# Patient Record
Sex: Male | Born: 1945
Health system: Southern US, Community
[De-identification: ages and names within clinical notes are randomized; demographics above are authoritative.]

## PROBLEM LIST (undated history)

## (undated) DIAGNOSIS — K200 Eosinophilic esophagitis: Secondary | ICD-10-CM

## (undated) DIAGNOSIS — E7800 Pure hypercholesterolemia, unspecified: Secondary | ICD-10-CM

## (undated) DIAGNOSIS — K222 Esophageal obstruction: Secondary | ICD-10-CM

## (undated) DIAGNOSIS — N400 Enlarged prostate without lower urinary tract symptoms: Secondary | ICD-10-CM

## (undated) DIAGNOSIS — K219 Gastro-esophageal reflux disease without esophagitis: Secondary | ICD-10-CM

## (undated) DIAGNOSIS — E119 Type 2 diabetes mellitus without complications: Secondary | ICD-10-CM

## (undated) DIAGNOSIS — C73 Malignant neoplasm of thyroid gland: Secondary | ICD-10-CM

## (undated) HISTORY — PX: ESOPHAGOGASTRODUODENOSCOPY (EGD) WITH ESOPHAGEAL DILATION: SHX5812

## (undated) HISTORY — DX: Type 2 diabetes mellitus without complications: E11.9

## (undated) HISTORY — DX: Pure hypercholesterolemia, unspecified: E78.00

## (undated) HISTORY — PX: KNEE ARTHROSCOPY: SHX127

## (undated) HISTORY — DX: Gastro-esophageal reflux disease without esophagitis: K21.9

## (undated) HISTORY — DX: Malignant neoplasm of thyroid gland: C73

## (undated) HISTORY — PX: OTHER SURGICAL HISTORY: SHX169

## (undated) HISTORY — DX: Benign prostatic hyperplasia without lower urinary tract symptoms: N40.0

## (undated) HISTORY — DX: Esophageal obstruction: K22.2

## (undated) HISTORY — PX: THYROIDECTOMY, PARTIAL: SHX18

## (undated) HISTORY — PX: COLONOSCOPY: SHX174

## (undated) HISTORY — PX: SHOULDER SURGERY: SHX246

## (undated) HISTORY — DX: Eosinophilic esophagitis: K20.0

---

## 1999-08-12 ENCOUNTER — Encounter: Payer: Self-pay | Admitting: *Deleted

## 1999-08-12 ENCOUNTER — Ambulatory Visit (HOSPITAL_COMMUNITY): Admission: RE | Admit: 1999-08-12 | Discharge: 1999-08-12 | Payer: Self-pay | Admitting: *Deleted

## 1999-08-30 ENCOUNTER — Ambulatory Visit (HOSPITAL_COMMUNITY): Admission: RE | Admit: 1999-08-30 | Discharge: 1999-08-30 | Payer: Self-pay | Admitting: *Deleted

## 1999-08-30 ENCOUNTER — Encounter: Payer: Self-pay | Admitting: *Deleted

## 2000-03-19 ENCOUNTER — Ambulatory Visit (HOSPITAL_COMMUNITY): Admission: RE | Admit: 2000-03-19 | Discharge: 2000-03-19 | Payer: Self-pay | Admitting: Dentistry

## 2000-03-19 ENCOUNTER — Encounter: Payer: Self-pay | Admitting: Endocrinology

## 2000-03-25 ENCOUNTER — Ambulatory Visit (HOSPITAL_COMMUNITY): Admission: RE | Admit: 2000-03-25 | Discharge: 2000-03-25 | Payer: Self-pay | Admitting: Endocrinology

## 2000-03-25 ENCOUNTER — Encounter: Payer: Self-pay | Admitting: Endocrinology

## 2002-04-07 ENCOUNTER — Encounter: Admission: RE | Admit: 2002-04-07 | Discharge: 2002-07-06 | Payer: Self-pay | Admitting: Endocrinology

## 2006-02-15 ENCOUNTER — Encounter: Admission: RE | Admit: 2006-02-15 | Discharge: 2006-02-15 | Payer: Self-pay | Admitting: Endocrinology

## 2008-09-09 ENCOUNTER — Ambulatory Visit (HOSPITAL_COMMUNITY): Admission: RE | Admit: 2008-09-09 | Discharge: 2008-09-09 | Payer: Self-pay | Admitting: *Deleted

## 2010-10-02 ENCOUNTER — Encounter: Payer: Self-pay | Admitting: Endocrinology

## 2010-10-03 ENCOUNTER — Encounter: Payer: Self-pay | Admitting: Endocrinology

## 2011-01-18 ENCOUNTER — Other Ambulatory Visit: Payer: Self-pay | Admitting: Internal Medicine

## 2011-01-18 ENCOUNTER — Ambulatory Visit
Admission: RE | Admit: 2011-01-18 | Discharge: 2011-01-18 | Disposition: A | Discharge status: Home / Self Care | Payer: Medicare Other | Source: Ambulatory Visit | Attending: Internal Medicine | Admitting: Internal Medicine

## 2011-01-18 DIAGNOSIS — M25561 Pain in right knee: Secondary | ICD-10-CM

## 2011-01-24 NOTE — Op Note (Signed)
NAMETEVITA, GOMER NO.:  [id number]   MEDICAL RECORD NO.:  [id number]          PATIENT TYPE:  AMB   LOCATION:  ENDO                         FACILITY:  [facility]   PHYSICIAN:  Georgiana Spinner, M.D.    DATE OF BIRTH:  05-Apr-1946   DATE OF PROCEDURE:  DATE OF DISCHARGE:                               OPERATIVE REPORT   PROCEDURE:  Upper endoscopy with dilation.   INDICATIONS:  Dysphagia.   ANESTHESIA:  Fentanyl 75 mcg, Versed 6 mg.   PROCEDURE:  With the patient mildly sedated in the left lateral  decubitus position, the Pentax videoscopic endoscope was inserted in the  mouth and passed under direct vision through the esophagus, which  appeared normal until we reached the distal esophagus and there appeared  to be a distal esophageal stricture.  We photographed this and entered  into the stomach.  The fundus, body, antrum were seen, with a hiatal  hernia noted.  The duodenal bulb, second portion of the duodenum were  entered and both appeared normal.  From this point the endoscope was  slowly withdrawn, taking circumferential views of the duodenal mucosa  until the endoscope had been pulled back into the stomach, placed in  retroflexion to view the stomach from below.  The endoscope was then  straightened.  A guidewire was passed.  The endoscope was withdrawn.  Subsequently a 15 Savary dilator was passed over the guidewire.  With  removal, there was a trace amount of blood on the dilator, so I elected  to pull the guidewire and reinsert the endoscope.  There was some blood  seen at the site of the stricture and it appeared that we had  successfully dilated the stricture, so therefore I elected to then  withdraw the endoscope, taking circumferential views of the remaining  gastric and esophageal mucosa.  The patient's vital signs, pulse  oximeter remained stable.  The patient tolerated the procedure well  without apparent complications.   FINDINGS:  Stricture of the  distal esophagus, dilated to 15 mm.   PLAN:  Will have the patient on a liquid diet today.  Is to resume a  regular diet tomorrow.  Start the patient on Prilosec therapy daily and  have the patient follow up with me in about 4-6 weeks as an outpatient  to assess clinical response.           ______________________________  Georgiana Spinner, M.D.     GMO/MEDQ  D:  09/09/2008  T:  09/09/2008  Job:  [id number]

## 2011-10-26 DIAGNOSIS — M25519 Pain in unspecified shoulder: Secondary | ICD-10-CM | POA: Diagnosis not present

## 2011-10-26 DIAGNOSIS — M719 Bursopathy, unspecified: Secondary | ICD-10-CM | POA: Diagnosis not present

## 2011-12-07 DIAGNOSIS — M19019 Primary osteoarthritis, unspecified shoulder: Secondary | ICD-10-CM | POA: Diagnosis not present

## 2011-12-15 DIAGNOSIS — M25529 Pain in unspecified elbow: Secondary | ICD-10-CM | POA: Diagnosis not present

## 2011-12-18 DIAGNOSIS — Q740 Other congenital malformations of upper limb(s), including shoulder girdle: Secondary | ICD-10-CM | POA: Diagnosis not present

## 2011-12-18 DIAGNOSIS — S43439A Superior glenoid labrum lesion of unspecified shoulder, initial encounter: Secondary | ICD-10-CM | POA: Diagnosis not present

## 2011-12-21 DIAGNOSIS — M19019 Primary osteoarthritis, unspecified shoulder: Secondary | ICD-10-CM | POA: Diagnosis not present

## 2011-12-21 DIAGNOSIS — M6580 Other synovitis and tenosynovitis, unspecified site: Secondary | ICD-10-CM | POA: Diagnosis not present

## 2011-12-21 DIAGNOSIS — Q740 Other congenital malformations of upper limb(s), including shoulder girdle: Secondary | ICD-10-CM | POA: Diagnosis not present

## 2011-12-21 DIAGNOSIS — M75100 Unspecified rotator cuff tear or rupture of unspecified shoulder, not specified as traumatic: Secondary | ICD-10-CM | POA: Diagnosis not present

## 2011-12-21 DIAGNOSIS — M25519 Pain in unspecified shoulder: Secondary | ICD-10-CM | POA: Diagnosis not present

## 2011-12-21 DIAGNOSIS — M719 Bursopathy, unspecified: Secondary | ICD-10-CM | POA: Diagnosis not present

## 2011-12-21 DIAGNOSIS — S43439A Superior glenoid labrum lesion of unspecified shoulder, initial encounter: Secondary | ICD-10-CM | POA: Diagnosis not present

## 2011-12-21 DIAGNOSIS — M25529 Pain in unspecified elbow: Secondary | ICD-10-CM | POA: Diagnosis not present

## 2011-12-21 DIAGNOSIS — M67919 Unspecified disorder of synovium and tendon, unspecified shoulder: Secondary | ICD-10-CM | POA: Diagnosis not present

## 2012-01-02 DIAGNOSIS — M25519 Pain in unspecified shoulder: Secondary | ICD-10-CM | POA: Diagnosis not present

## 2012-01-04 DIAGNOSIS — M25519 Pain in unspecified shoulder: Secondary | ICD-10-CM | POA: Diagnosis not present

## 2012-01-08 DIAGNOSIS — M25519 Pain in unspecified shoulder: Secondary | ICD-10-CM | POA: Diagnosis not present

## 2012-01-15 DIAGNOSIS — M25519 Pain in unspecified shoulder: Secondary | ICD-10-CM | POA: Diagnosis not present

## 2012-01-18 DIAGNOSIS — M25519 Pain in unspecified shoulder: Secondary | ICD-10-CM | POA: Diagnosis not present

## 2012-01-22 DIAGNOSIS — M25519 Pain in unspecified shoulder: Secondary | ICD-10-CM | POA: Diagnosis not present

## 2012-01-31 DIAGNOSIS — M25519 Pain in unspecified shoulder: Secondary | ICD-10-CM | POA: Diagnosis not present

## 2012-02-07 DIAGNOSIS — M25519 Pain in unspecified shoulder: Secondary | ICD-10-CM | POA: Diagnosis not present

## 2012-02-13 DIAGNOSIS — M25519 Pain in unspecified shoulder: Secondary | ICD-10-CM | POA: Diagnosis not present

## 2012-02-15 DIAGNOSIS — M25519 Pain in unspecified shoulder: Secondary | ICD-10-CM | POA: Diagnosis not present

## 2012-02-20 DIAGNOSIS — M25519 Pain in unspecified shoulder: Secondary | ICD-10-CM | POA: Diagnosis not present

## 2012-02-22 DIAGNOSIS — M25519 Pain in unspecified shoulder: Secondary | ICD-10-CM | POA: Diagnosis not present

## 2012-02-27 DIAGNOSIS — M25519 Pain in unspecified shoulder: Secondary | ICD-10-CM | POA: Diagnosis not present

## 2012-02-29 DIAGNOSIS — M25519 Pain in unspecified shoulder: Secondary | ICD-10-CM | POA: Diagnosis not present

## 2012-06-26 DIAGNOSIS — M25519 Pain in unspecified shoulder: Secondary | ICD-10-CM | POA: Diagnosis not present

## 2012-09-27 DIAGNOSIS — E119 Type 2 diabetes mellitus without complications: Secondary | ICD-10-CM | POA: Diagnosis not present

## 2012-09-27 DIAGNOSIS — E789 Disorder of lipoprotein metabolism, unspecified: Secondary | ICD-10-CM | POA: Diagnosis not present

## 2012-10-04 DIAGNOSIS — E0789 Other specified disorders of thyroid: Secondary | ICD-10-CM | POA: Diagnosis not present

## 2012-10-04 DIAGNOSIS — E119 Type 2 diabetes mellitus without complications: Secondary | ICD-10-CM | POA: Diagnosis not present

## 2012-10-04 DIAGNOSIS — E789 Disorder of lipoprotein metabolism, unspecified: Secondary | ICD-10-CM | POA: Diagnosis not present

## 2012-10-07 DIAGNOSIS — E0789 Other specified disorders of thyroid: Secondary | ICD-10-CM | POA: Diagnosis not present

## 2012-10-07 DIAGNOSIS — Z125 Encounter for screening for malignant neoplasm of prostate: Secondary | ICD-10-CM | POA: Diagnosis not present

## 2013-03-27 DIAGNOSIS — E789 Disorder of lipoprotein metabolism, unspecified: Secondary | ICD-10-CM | POA: Diagnosis not present

## 2013-03-27 DIAGNOSIS — E119 Type 2 diabetes mellitus without complications: Secondary | ICD-10-CM | POA: Diagnosis not present

## 2013-11-28 DIAGNOSIS — E789 Disorder of lipoprotein metabolism, unspecified: Secondary | ICD-10-CM | POA: Diagnosis not present

## 2013-11-28 DIAGNOSIS — E119 Type 2 diabetes mellitus without complications: Secondary | ICD-10-CM | POA: Diagnosis not present

## 2013-11-28 DIAGNOSIS — E0789 Other specified disorders of thyroid: Secondary | ICD-10-CM | POA: Diagnosis not present

## 2014-03-20 DIAGNOSIS — E119 Type 2 diabetes mellitus without complications: Secondary | ICD-10-CM | POA: Diagnosis not present

## 2014-11-13 DIAGNOSIS — E118 Type 2 diabetes mellitus with unspecified complications: Secondary | ICD-10-CM | POA: Diagnosis not present

## 2014-11-13 DIAGNOSIS — E0789 Other specified disorders of thyroid: Secondary | ICD-10-CM | POA: Diagnosis not present

## 2014-11-13 DIAGNOSIS — M4000 Postural kyphosis, site unspecified: Secondary | ICD-10-CM | POA: Diagnosis not present

## 2014-11-13 DIAGNOSIS — E789 Disorder of lipoprotein metabolism, unspecified: Secondary | ICD-10-CM | POA: Diagnosis not present

## 2014-11-30 DIAGNOSIS — M25511 Pain in right shoulder: Secondary | ICD-10-CM | POA: Diagnosis not present

## 2015-02-19 DIAGNOSIS — E118 Type 2 diabetes mellitus with unspecified complications: Secondary | ICD-10-CM | POA: Diagnosis not present

## 2015-02-19 DIAGNOSIS — E789 Disorder of lipoprotein metabolism, unspecified: Secondary | ICD-10-CM | POA: Diagnosis not present

## 2015-02-19 DIAGNOSIS — E0789 Other specified disorders of thyroid: Secondary | ICD-10-CM | POA: Diagnosis not present

## 2016-01-24 DIAGNOSIS — E789 Disorder of lipoprotein metabolism, unspecified: Secondary | ICD-10-CM | POA: Diagnosis not present

## 2016-01-24 DIAGNOSIS — E118 Type 2 diabetes mellitus with unspecified complications: Secondary | ICD-10-CM | POA: Diagnosis not present

## 2016-01-28 DIAGNOSIS — E789 Disorder of lipoprotein metabolism, unspecified: Secondary | ICD-10-CM | POA: Diagnosis not present

## 2016-01-28 DIAGNOSIS — E118 Type 2 diabetes mellitus with unspecified complications: Secondary | ICD-10-CM | POA: Diagnosis not present

## 2016-01-28 DIAGNOSIS — C73 Malignant neoplasm of thyroid gland: Secondary | ICD-10-CM | POA: Diagnosis not present

## 2016-06-27 DIAGNOSIS — K449 Diaphragmatic hernia without obstruction or gangrene: Secondary | ICD-10-CM | POA: Diagnosis not present

## 2016-06-27 DIAGNOSIS — K648 Other hemorrhoids: Secondary | ICD-10-CM | POA: Diagnosis not present

## 2016-06-27 DIAGNOSIS — R1310 Dysphagia, unspecified: Secondary | ICD-10-CM | POA: Diagnosis not present

## 2016-06-27 DIAGNOSIS — K222 Esophageal obstruction: Secondary | ICD-10-CM | POA: Diagnosis not present

## 2016-06-27 DIAGNOSIS — K5730 Diverticulosis of large intestine without perforation or abscess without bleeding: Secondary | ICD-10-CM | POA: Diagnosis not present

## 2016-06-27 DIAGNOSIS — Z86010 Personal history of colonic polyps: Secondary | ICD-10-CM | POA: Diagnosis not present

## 2016-06-27 DIAGNOSIS — Z1211 Encounter for screening for malignant neoplasm of colon: Secondary | ICD-10-CM | POA: Diagnosis not present

## 2016-06-27 DIAGNOSIS — K641 Second degree hemorrhoids: Secondary | ICD-10-CM | POA: Diagnosis not present

## 2016-07-24 DIAGNOSIS — E118 Type 2 diabetes mellitus with unspecified complications: Secondary | ICD-10-CM | POA: Diagnosis not present

## 2016-07-24 DIAGNOSIS — E789 Disorder of lipoprotein metabolism, unspecified: Secondary | ICD-10-CM | POA: Diagnosis not present

## 2016-07-24 DIAGNOSIS — E034 Atrophy of thyroid (acquired): Secondary | ICD-10-CM | POA: Diagnosis not present

## 2016-08-01 DIAGNOSIS — E118 Type 2 diabetes mellitus with unspecified complications: Secondary | ICD-10-CM | POA: Diagnosis not present

## 2016-08-01 DIAGNOSIS — E789 Disorder of lipoprotein metabolism, unspecified: Secondary | ICD-10-CM | POA: Diagnosis not present

## 2016-08-01 DIAGNOSIS — E034 Atrophy of thyroid (acquired): Secondary | ICD-10-CM | POA: Diagnosis not present

## 2016-08-01 DIAGNOSIS — Z125 Encounter for screening for malignant neoplasm of prostate: Secondary | ICD-10-CM | POA: Diagnosis not present

## 2016-08-07 DIAGNOSIS — E119 Type 2 diabetes mellitus without complications: Secondary | ICD-10-CM | POA: Diagnosis not present

## 2016-08-30 DIAGNOSIS — N138 Other obstructive and reflux uropathy: Secondary | ICD-10-CM | POA: Diagnosis not present

## 2016-08-30 DIAGNOSIS — R972 Elevated prostate specific antigen [PSA]: Secondary | ICD-10-CM | POA: Diagnosis not present

## 2016-08-30 DIAGNOSIS — N401 Enlarged prostate with lower urinary tract symptoms: Secondary | ICD-10-CM | POA: Diagnosis not present

## 2016-09-26 DIAGNOSIS — R972 Elevated prostate specific antigen [PSA]: Secondary | ICD-10-CM | POA: Diagnosis not present

## 2016-10-09 DIAGNOSIS — N401 Enlarged prostate with lower urinary tract symptoms: Secondary | ICD-10-CM | POA: Diagnosis not present

## 2016-10-09 DIAGNOSIS — N138 Other obstructive and reflux uropathy: Secondary | ICD-10-CM | POA: Diagnosis not present

## 2016-10-09 DIAGNOSIS — N529 Male erectile dysfunction, unspecified: Secondary | ICD-10-CM | POA: Diagnosis not present

## 2016-10-09 DIAGNOSIS — R972 Elevated prostate specific antigen [PSA]: Secondary | ICD-10-CM | POA: Diagnosis not present

## 2017-01-25 DIAGNOSIS — E034 Atrophy of thyroid (acquired): Secondary | ICD-10-CM | POA: Diagnosis not present

## 2017-01-25 DIAGNOSIS — E789 Disorder of lipoprotein metabolism, unspecified: Secondary | ICD-10-CM | POA: Diagnosis not present

## 2017-01-25 DIAGNOSIS — E118 Type 2 diabetes mellitus with unspecified complications: Secondary | ICD-10-CM | POA: Diagnosis not present

## 2017-01-30 DIAGNOSIS — E118 Type 2 diabetes mellitus with unspecified complications: Secondary | ICD-10-CM | POA: Diagnosis not present

## 2017-01-30 DIAGNOSIS — E789 Disorder of lipoprotein metabolism, unspecified: Secondary | ICD-10-CM | POA: Diagnosis not present

## 2017-03-30 DIAGNOSIS — D171 Benign lipomatous neoplasm of skin and subcutaneous tissue of trunk: Secondary | ICD-10-CM | POA: Diagnosis not present

## 2017-06-22 DIAGNOSIS — D171 Benign lipomatous neoplasm of skin and subcutaneous tissue of trunk: Secondary | ICD-10-CM | POA: Diagnosis not present

## 2017-06-22 DIAGNOSIS — D1722 Benign lipomatous neoplasm of skin and subcutaneous tissue of left arm: Secondary | ICD-10-CM | POA: Diagnosis not present

## 2017-07-18 DIAGNOSIS — E118 Type 2 diabetes mellitus with unspecified complications: Secondary | ICD-10-CM | POA: Diagnosis not present

## 2017-07-31 DIAGNOSIS — E785 Hyperlipidemia, unspecified: Secondary | ICD-10-CM | POA: Diagnosis not present

## 2017-07-31 DIAGNOSIS — E118 Type 2 diabetes mellitus with unspecified complications: Secondary | ICD-10-CM | POA: Diagnosis not present

## 2017-07-31 DIAGNOSIS — E039 Hypothyroidism, unspecified: Secondary | ICD-10-CM | POA: Diagnosis not present

## 2017-09-10 ENCOUNTER — Encounter (INDEPENDENT_AMBULATORY_CARE_PROVIDER_SITE_OTHER): Payer: Self-pay | Admitting: Physician Assistant

## 2017-09-10 ENCOUNTER — Ambulatory Visit (INDEPENDENT_AMBULATORY_CARE_PROVIDER_SITE_OTHER): Payer: Medicare Other | Admitting: Physician Assistant

## 2017-09-10 ENCOUNTER — Ambulatory Visit (INDEPENDENT_AMBULATORY_CARE_PROVIDER_SITE_OTHER): Payer: Medicare Other

## 2017-09-10 DIAGNOSIS — M67442 Ganglion, left hand: Secondary | ICD-10-CM

## 2017-09-10 DIAGNOSIS — M79645 Pain in left finger(s): Secondary | ICD-10-CM

## 2017-09-10 MED ORDER — LIDOCAINE HCL 1 % IJ SOLN
1.0000 mL | INTRAMUSCULAR | Status: AC | PRN
  Administered 2017-09-10: 1 mL

## 2017-09-10 MED ORDER — DICLOFENAC SODIUM 1 % TD GEL: 2.0000 g | Freq: Three times a day (TID) | TRANSDERMAL | Status: DC

## 2017-09-10 MED ORDER — DICLOFENAC SODIUM 1 % TD GEL: 2.0000 g | Freq: Three times a day (TID) | TRANSDERMAL | 0 refills | Status: DC

## 2017-09-10 MED ORDER — METHYLPREDNISOLONE ACETATE 40 MG/ML IJ SUSP
20.0000 mg | INTRAMUSCULAR | Status: AC | PRN
  Administered 2017-09-10: 20 mg

## 2017-09-10 NOTE — Progress Notes (Signed)
Office Visit Note   Patient: George Ford           Date of Birth: August 18, 1946           MRN: [id number] Visit Date: 09/10/2017              Requested by: No referring provider defined for this encounter. PCP: Patient, No Pcp Per   Assessment & Plan: Visit Diagnoses:  1. Pain in left finger(s)   2. Ganglion cyst of finger of left hand     Plan: We have called in some Voltaren gel that he can use 2 g up to 3 times daily on the left middle finger DIP joint region.  Leave the bandage intact until this evening and remove it.  I did discuss with him that there is a 50% chance that the mucoid cyst will return and at some point time if it becomes bothersome he may require surgical excision of the cyst.  He will follow-up with [place] on an as-needed basis  Follow-Up Instructions: Return if symptoms worsen or fail to improve.   Orders:  Orders Placed This Encounter  Procedures  . Hand/UE Inj  . XR Finger Middle Left   Meds ordered this encounter  Medications  . diclofenac sodium (VOLTAREN) 1 % transdermal gel 2 g      Procedures: Hand/UE Inj for (Left 3rd finger) on 09/10/2017 12:55 PM Details: 22 G needle, dorsal approach Medications: 1 mL lidocaine 1 %; 20 mg methylPREDNISolone acetate 40 MG/ML Aspirate: clear Consent was given by the parent.       Clinical Data: No additional findings.   Subjective: Chief Complaint  Patient presents with  . Left Middle Finger - Pain    HPI George Ford is well-known to Dr. Ninfa Linden service comes in today due to his left middle finger having a cyst near the nail bed.  He states that is been ongoing for years but is gotten worse over the last few months.  Causing pain whenever he plays golf.  He has had no fevers chills no drainage from the cyst.  He denies any injury to the left middle finger. Review of Systems See HPI otherwise negative  Objective: Vital Signs: There were no vitals taken for this visit.  Physical Exam    Constitutional: He is oriented to person, place, and time. He appears well-developed and well-nourished. No distress.  Pulmonary/Chest: Effort normal.  Neurological: He is alert and oriented to person, place, and time.  Skin: He is not diaphoretic.    Ortho Exam Left hand he has some slight tenderness over the distal left middle finger phalanx.  Cyst dorsal aspect of the middle finger near the nail nail bed.  It is not over the DIP joint.  He has good range of motion of the DIP joint of the left little finger without pain.  The remainder of the hand and wrist are nontender.  Radial pulses intact. Specialty Comments:  No specialty comments available.  Imaging: Xr Finger Middle Left  Result Date: 09/10/2017 3 views left middle finger: No acute fractures.  No subluxation dislocation.  The DIP joint appears well maintained.  There is no bony destruction or demineralization.    PMFS History: There are no active problems to display for this patient.  History reviewed. No pertinent past medical history.  History reviewed. No pertinent family history.  History reviewed. No pertinent surgical history. Social History   Occupational History  . Not on file  Tobacco Use  .  Smoking status: Not on file  Substance and Sexual Activity  . Alcohol use: Not on file  . Drug use: Not on file  . Sexual activity: Not on file

## 2017-09-10 NOTE — Addendum Note (Signed)
Addended by: Minda Ditto, Geoffery Spruce on: 09/10/2017 05:46 PM   Modules accepted: Orders

## 2017-10-18 DIAGNOSIS — R972 Elevated prostate specific antigen [PSA]: Secondary | ICD-10-CM | POA: Diagnosis not present

## 2017-10-25 DIAGNOSIS — N401 Enlarged prostate with lower urinary tract symptoms: Secondary | ICD-10-CM | POA: Diagnosis not present

## 2017-10-25 DIAGNOSIS — N138 Other obstructive and reflux uropathy: Secondary | ICD-10-CM | POA: Diagnosis not present

## 2018-01-11 DIAGNOSIS — E118 Type 2 diabetes mellitus with unspecified complications: Secondary | ICD-10-CM | POA: Diagnosis not present

## 2018-01-11 DIAGNOSIS — E785 Hyperlipidemia, unspecified: Secondary | ICD-10-CM | POA: Diagnosis not present

## 2018-01-11 DIAGNOSIS — E039 Hypothyroidism, unspecified: Secondary | ICD-10-CM | POA: Diagnosis not present

## 2018-01-23 DIAGNOSIS — E032 Hypothyroidism due to medicaments and other exogenous substances: Secondary | ICD-10-CM | POA: Diagnosis not present

## 2018-01-23 DIAGNOSIS — N401 Enlarged prostate with lower urinary tract symptoms: Secondary | ICD-10-CM | POA: Diagnosis not present

## 2018-01-23 DIAGNOSIS — E118 Type 2 diabetes mellitus with unspecified complications: Secondary | ICD-10-CM | POA: Diagnosis not present

## 2018-01-23 DIAGNOSIS — K222 Esophageal obstruction: Secondary | ICD-10-CM | POA: Diagnosis not present

## 2018-01-23 DIAGNOSIS — Z0000 Encounter for general adult medical examination without abnormal findings: Secondary | ICD-10-CM | POA: Diagnosis not present

## 2018-01-23 DIAGNOSIS — Z23 Encounter for immunization: Secondary | ICD-10-CM | POA: Diagnosis not present

## 2018-01-23 DIAGNOSIS — Z85850 Personal history of malignant neoplasm of thyroid: Secondary | ICD-10-CM | POA: Diagnosis not present

## 2018-01-23 DIAGNOSIS — E7800 Pure hypercholesterolemia, unspecified: Secondary | ICD-10-CM | POA: Diagnosis not present

## 2018-01-28 DIAGNOSIS — E119 Type 2 diabetes mellitus without complications: Secondary | ICD-10-CM | POA: Diagnosis not present

## 2018-01-28 DIAGNOSIS — E039 Hypothyroidism, unspecified: Secondary | ICD-10-CM | POA: Diagnosis not present

## 2018-01-28 DIAGNOSIS — Z85850 Personal history of malignant neoplasm of thyroid: Secondary | ICD-10-CM | POA: Diagnosis not present

## 2018-01-29 DIAGNOSIS — E118 Type 2 diabetes mellitus with unspecified complications: Secondary | ICD-10-CM | POA: Diagnosis not present

## 2018-01-31 DIAGNOSIS — N138 Other obstructive and reflux uropathy: Secondary | ICD-10-CM | POA: Diagnosis not present

## 2018-01-31 DIAGNOSIS — N401 Enlarged prostate with lower urinary tract symptoms: Secondary | ICD-10-CM | POA: Diagnosis not present

## 2018-01-31 DIAGNOSIS — R972 Elevated prostate specific antigen [PSA]: Secondary | ICD-10-CM | POA: Diagnosis not present

## 2018-02-12 DIAGNOSIS — Z85850 Personal history of malignant neoplasm of thyroid: Secondary | ICD-10-CM | POA: Diagnosis not present

## 2018-02-12 DIAGNOSIS — E039 Hypothyroidism, unspecified: Secondary | ICD-10-CM | POA: Diagnosis not present

## 2018-02-12 DIAGNOSIS — E119 Type 2 diabetes mellitus without complications: Secondary | ICD-10-CM | POA: Diagnosis not present

## 2018-02-15 ENCOUNTER — Telehealth: Payer: Self-pay | Admitting: Internal Medicine

## 2018-02-15 NOTE — Telephone Encounter (Signed)
We have received a referral from pt's PCP on 01/23/18 pm requesting pt being seen for recurrent esophageal stricture. Patient has GI Hx with Dr. Earlean Shawl and is requesting to transfer care to Dr. Carlean Purl. GI Records have been received and placed on Dr. Celesta Aver desk for review.

## 2018-02-18 NOTE — Telephone Encounter (Signed)
I am happy to see the patient  However seeing me on a Monday may take a while  It said his appointment was not urgent but if he is having frequent swallowing problems let me know.  If we need to add him in on a Monday talk to PJ and she can make that happen.  I do not see Dr. Earlean Shawl records re: endoscopy and/or colonoscopy   What we got were urology records  It is not 100% essential to have the records but if we can get them would help - note that Dr. Earlean Shawl only with [facility] for about 1 year so may need the paper records from when he saw Dr. Melrose Nakayama

## 2018-02-19 ENCOUNTER — Encounter: Payer: Self-pay | Admitting: Internal Medicine

## 2018-02-20 NOTE — Telephone Encounter (Signed)
Pt has an appt schedule on 03/11/18 at 3:00pm

## 2018-03-11 ENCOUNTER — Ambulatory Visit (INDEPENDENT_AMBULATORY_CARE_PROVIDER_SITE_OTHER): Payer: Medicare Other | Admitting: Internal Medicine

## 2018-03-11 ENCOUNTER — Encounter: Payer: Self-pay | Admitting: Internal Medicine

## 2018-03-11 VITALS — BP 110/70 | HR 84 | Ht 72.0 in | Wt 169.0 lb

## 2018-03-11 DIAGNOSIS — R1319 Other dysphagia: Secondary | ICD-10-CM

## 2018-03-11 DIAGNOSIS — R1310 Dysphagia, unspecified: Secondary | ICD-10-CM

## 2018-03-11 DIAGNOSIS — K222 Esophageal obstruction: Secondary | ICD-10-CM | POA: Diagnosis not present

## 2018-03-11 NOTE — Progress Notes (Signed)
   SRIYAN CUTTING, DDS 72 y.o. 09-Sep-1946 [id number]  Assessment & Plan:   Encounter Diagnoses  Name Primary?  . Esophageal dysphagia Yes  . Esophageal stricture     Schedule EGD/dilation The risks and benefits as well as alternatives of endoscopic procedure(s) have been discussed and reviewed. All questions answered. The patient agrees to proceed. Consider PPI Tx   I appreciate the opportunity to care for you. [id number] Shelia Media, MD   Subjective:   Chief Complaint: dysphagia  HPI 72 yo wm w/ intermittent solid dysphagia. Has had 2 dilations by Dr. Earlean Shawl w/ screening colonoscopies and last was 1-2 years agio and results not so good - persistent sxs. Sxs 1 x a week now Still w/ intermittent soild dysphagia. No sig heartburn. Wt Readings from Last 3 Encounters:  03/11/18 169 lb (76.7 kg)   No weight loss Colonoscopy was neg per patient   No Known Allergies Current Meds  Medication Sig  . Empagliflozin (JARDIANCE PO) Take 12.5 mg by mouth daily.  . Insulin Detemir (LEVEMIR Mancos) Inject 22-28 Units into the skin at bedtime.   Marland Kitchen levothyroxine (SYNTHROID, LEVOTHROID) 137 MCG tablet Take 137 mcg by mouth daily before breakfast.  . metFORMIN (GLUCOPHAGE-XR) 750 MG 24 hr tablet Take 750 mg by mouth daily with breakfast. Take 1/2 tablet in the AM and 1 tablet in the PM   . rosuvastatin (CRESTOR) 20 MG tablet Take 10 mg by mouth daily.  . tamsulosin (FLOMAX) 0.4 MG CAPS capsule Take 0.4 mg by mouth. Take  1 capsule 30 minutes after the same meal each day   Past Medical History:  Diagnosis Date  . BPH (benign prostatic hyperplasia)   . Diabetes ([place])    type 2  . Esophageal stricture   . Hypercholesterolemia   . Thyroid cancer [facility])    Past Surgical History:  Procedure Laterality Date  . COLONOSCOPY    . ESOPHAGOGASTRODUODENOSCOPY (EGD) WITH ESOPHAGEAL DILATION    . KNEE ARTHROSCOPY Right   . lipoma removal    . SHOULDER SURGERY    . 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   Review of Systems All other ROS neg  Objective:   Physical Exam @BP  110/70   Pulse 84   Ht 6' (1.829 m)   Wt 169 lb (76.7 kg)   BMI 22.92 kg/m @  General:  Well-developed, well-nourished and in no acute distress Eyes:  anicteric. ENT:   Mouth and posterior pharynx free of lesions.  Neck:   supple w/o thyromegaly or mass.  Lungs: Clear to auscultation bilaterally. Heart:  S1S2, no rubs, murmurs, gallops. Abdomen:  soft, non-tender, no hepatosplenomegaly, hernia, or mass and BS+.  Lymph:  no cervical or supraclavicular adenopathy. Extremities:   no edema, cyanosis or clubbing Skin   tanned Neuro:  A&O x 3.  Psych:  appropriate mood and  Affect.   Data Reviewed:  PCP notes

## 2018-03-11 NOTE — Patient Instructions (Signed)
  You have been scheduled for an endoscopy. Please follow written instructions given to you at your visit today. If you use inhalers (even only as needed), please bring them with you on the day of your procedure.   I appreciate the opportunity to care for you. Carl Gessner, MD, FACG 

## 2018-03-13 ENCOUNTER — Encounter: Payer: Self-pay | Admitting: Internal Medicine

## 2018-03-13 ENCOUNTER — Ambulatory Visit (AMBULATORY_SURGERY_CENTER): Payer: Medicare Other | Admitting: Internal Medicine

## 2018-03-13 VITALS — BP 106/65 | HR 64 | Temp 97.7°F | Resp 9 | Ht 72.0 in | Wt 169.0 lb

## 2018-03-13 DIAGNOSIS — K222 Esophageal obstruction: Secondary | ICD-10-CM

## 2018-03-13 DIAGNOSIS — E039 Hypothyroidism, unspecified: Secondary | ICD-10-CM | POA: Diagnosis not present

## 2018-03-13 DIAGNOSIS — K209 Esophagitis, unspecified: Secondary | ICD-10-CM | POA: Diagnosis not present

## 2018-03-13 DIAGNOSIS — R1310 Dysphagia, unspecified: Secondary | ICD-10-CM | POA: Diagnosis present

## 2018-03-13 DIAGNOSIS — K229 Disease of esophagus, unspecified: Secondary | ICD-10-CM | POA: Diagnosis not present

## 2018-03-13 DIAGNOSIS — K449 Diaphragmatic hernia without obstruction or gangrene: Secondary | ICD-10-CM

## 2018-03-13 DIAGNOSIS — E119 Type 2 diabetes mellitus without complications: Secondary | ICD-10-CM | POA: Diagnosis not present

## 2018-03-13 MED ORDER — OMEPRAZOLE 40 MG PO CPDR: 40.0000 mg | DELAYED_RELEASE_CAPSULE | Freq: Every day | ORAL | 3 refills | Status: DC

## 2018-03-13 MED ORDER — SODIUM CHLORIDE 0.9 % IV SOLN: 500.0000 mL | Freq: Once | INTRAVENOUS | Status: DC

## 2018-03-13 NOTE — Progress Notes (Signed)
Called to room to assist during endoscopic procedure.  Patient ID and intended procedure confirmed with present staff. Received instructions for my participation in the procedure from the performing physician.  

## 2018-03-13 NOTE — Patient Instructions (Addendum)
I found and dilated a stricture to 16 mm - goal is usually 18 mm but 16 was enough for today. There is esophageal inflammation ? From GERD (acid reflux) and/or eosinophilic esophagitis.  In eosinophilic esophagitis (e-o-sin-o-FILL-ik uh-sof-uh-JIE-tis), a type of white blood cell (eosinophil) builds up in the lining of the tube that connects your mouth to your stomach (esophagus). This buildup, which is a reaction to foods, allergens or acid reflux, can inflame or injure the esophageal tissue. Damaged esophageal tissue can lead to difficulty swallowing or cause food to get caught when you swallow.  Eosinophilic esophagitis is a chronic immune system disease. It has been identified only in the past two decades, but is now considered a major cause of digestive system (gastrointestinal) illness. Research is ongoing and will likely lead to revisions in its diagnosis and treatment.  I have prescribed omeprazole and want you to take it every day about 30 minutes before first meal. It will reduce inflammation and prevent further problems.  I will let you know pathology results and plans - you could need another dilation after your trio. Have a great time and beware chronic acid suppression increases risk of acquiring a gastrointestinal infection so bottled water, no ice, careful with raw foods.   Follow an antireflux regimen indefinitely.  This includes:      - Do not lie down for at least 3 to 4 hours after meals.       - Raise the head of the bed 4 to 6 inches.       - Decrease excess weight.       - Avoid citrus juices and other acidic foods, alcohol, chocolate, mints, coffee and other caffeinated beverages, carbonated beverages, fatty and fried foods.       - Avoid tight-fitting clothing.       - Avoid cigarettes and other tobacco products.    I appreciate the opportunity to care for you. Gatha Mayer, MD, Marval Regal  *esophagitis and  dilation diet handout given*,  YOU HAD AN  ENDOSCOPIC PROCEDURE TODAY AT [facility]:   Refer to the procedure report that was given to you for any specific questions about what was found during the examination.  If the procedure report does not answer your questions, please call your gastroenterologist to clarify.  If you requested that your care partner not be given the details of your procedure findings, then the procedure report has been included in a sealed envelope for you to review at your convenience later.  YOU SHOULD EXPECT: Some feelings of bloating in the abdomen. Passage of more gas than usual.  Walking can help get rid of the air that was put into your GI tract during the procedure and reduce the bloating. If you had a lower endoscopy (such as a colonoscopy or flexible sigmoidoscopy) you may notice spotting of blood in your stool or on the toilet paper. If you underwent a bowel prep for your procedure, you may not have a normal bowel movement for a few days.  Please Note:  You might notice some irritation and congestion in your nose or some drainage.  This is from the oxygen used during your procedure.  There is no need for concern and it should clear up in a day or so.  SYMPTOMS TO REPORT IMMEDIATELY:    Following upper endoscopy (EGD)  Vomiting of blood or coffee ground material  New chest pain or pain under the shoulder blades  Painful or persistently  difficult swallowing  New shortness of breath  Fever of 100F or higher  Black, tarry-looking stools  For urgent or emergent issues, a gastroenterologist can be reached at any hour by calling [phone].   DIET:  Nothing by mouth until 11:40 am, clear liquids for an hour after that, then soft foods for the rest of the day.  Tomorrow  you may proceed to your regular diet.  Drink plenty of fluids but you should avoid alcoholic beverages for 24 hours.  ACTIVITY:  You should plan to take it easy for the rest of today and you should NOT DRIVE or use heavy  machinery until tomorrow (because of the sedation medicines used during the test).    FOLLOW UP: Our staff will call the number listed on your records the next business day following your procedure to check on you and address any questions or concerns that you may have regarding the information given to you following your procedure. If we do not reach you, we will leave a message.  However, if you are feeling well and you are not experiencing any problems, there is no need to return our call.  We will assume that you have returned to your regular daily activities without incident.  If any biopsies were taken you will be contacted by phone or by letter within the next 1-3 weeks.  Please call us at [phone] if you have not heard about the biopsies in 3 weeks.    SIGNATURES/CONFIDENTIALITY: You and/or your care partner have signed paperwork which will be entered into your electronic medical record.  These signatures attest to the fact that that the information above on your After Visit Summary has been reviewed and is understood.  Full responsibility of the confidentiality of this discharge information lies with you and/or your care-partner.

## 2018-03-13 NOTE — Op Note (Signed)
[facility] Patient Name: George Ford Procedure Date: 03/13/2018 10:06 AM MRN: [id number] Endoscopist: Gatha Mayer , MD Age: 72 Referring MD:  Date of Birth: Jan 25, 1946 Gender: Male Account #: [id number] Procedure:                Upper GI endoscopy Indications:              Dysphagia Medicines:                Propofol per Anesthesia, Monitored Anesthesia Care Procedure:                Pre-Anesthesia Assessment:                           - Prior to the procedure, a History and Physical                            was performed, and patient medications and                            allergies were reviewed. The patient's tolerance of                            previous anesthesia was also reviewed. The risks                            and benefits of the procedure and the sedation                            options and risks were discussed with the patient.                            All questions were answered, and informed consent                            was obtained. Prior Anticoagulants: The patient has                            taken no previous anticoagulant or antiplatelet                            agents. ASA Grade Assessment: II - A patient with                            mild systemic disease. After reviewing the risks                            and benefits, the patient was deemed in                            satisfactory condition to undergo the procedure.                           After obtaining informed consent, the endoscope was  passed under direct vision. Throughout the                            procedure, the patient's blood pressure, pulse, and                            oxygen saturations were monitored continuously. The                            Endoscope was introduced through the mouth, and                            advanced to the second part of duodenum. The upper                            GI endoscopy was  accomplished without difficulty.                            The patient tolerated the procedure well. Scope In: Scope Out: Findings:                 One benign-appearing, intrinsic moderate stenosis                            was found at the gastroesophageal junction. This                            stenosis measured 1.3 cm (inner diameter). The                            stenosis was traversed. A TTS dilator was passed                            through the scope. Dilation with a 16-17-18 mm                            balloon dilator was performed to 16 mm. The                            dilation site was examined and showed moderate                            mucosal disruption. Estimated blood loss was                            minimal.                           Mucosal changes including longitudinal furrows,                            white plaques, circumferential folds and congestion                            (edema) were found in the  middle third of the                            esophagus and in the lower third of the esophagus.                            Biopsies were obtained from the proximal and distal                            esophagus with cold forceps for histology of                            suspected eosinophilic esophagitis. Verification of                            patient identification for the specimen was done.                            Estimated blood loss was minimal.                           A 4 cm hiatal hernia was present.                           The exam was otherwise without abnormality.                           The cardia and gastric fundus were normal on                            retroflexion. Complications:            No immediate complications. Estimated Blood Loss:     Estimated blood loss was minimal. Impression:               - Benign-appearing esophageal stenosis. Dilated.                           - Esophageal mucosal changes suggestive  of                            eosinophilic esophagitis. Biopsied.                           - 4 cm hiatal hernia.                           - The examination was otherwise normal. Recommendation:           - Patient has a contact number available for                            emergencies. The signs and symptoms of potential                            delayed complications were discussed with the  patient. Return to normal activities tomorrow.                            Written discharge instructions were provided to the                            patient.                           - Clear liquids x 1 hour then soft foods rest of                            day. Start prior diet tomorrow.                           - Continue present medications.                           - Use Prilosec (omeprazole) 40 mg PO daily                            indefinitely.                           - Follow an antireflux regimen indefinitely. Gatha Mayer, MD 03/13/2018 10:44:13 AM This report has been signed electronically.

## 2018-03-18 ENCOUNTER — Telehealth: Payer: Self-pay | Admitting: *Deleted

## 2018-03-18 ENCOUNTER — Telehealth: Payer: Self-pay

## 2018-03-18 NOTE — Telephone Encounter (Signed)
  Follow up Call-  Call back number 03/13/2018  Post procedure Call Back phone  # [phone]  Permission to leave phone message Yes  Some recent data might be hidden     Patient questions:  Do you have a fever, pain , or abdominal swelling? No. Pain Score  0 *  Have you tolerated food without any problems? Yes.    Have you been able to return to your normal activities? Yes.    Do you have any questions about your discharge instructions: Diet   No. Medications  No. Follow up visit  No.  Do you have questions or concerns about your Care? No.  Actions: * If pain score is 4 or above: No action needed, pain <4.

## 2018-03-18 NOTE — Telephone Encounter (Signed)
Attempted to reach pt. With follow-up call following endoscopic procedure 03/13/2018.  LM on pt.'s voice mail.  Will try to reach pt. Again later today.

## 2018-03-19 DIAGNOSIS — E119 Type 2 diabetes mellitus without complications: Secondary | ICD-10-CM | POA: Diagnosis not present

## 2018-03-19 DIAGNOSIS — Z85850 Personal history of malignant neoplasm of thyroid: Secondary | ICD-10-CM | POA: Diagnosis not present

## 2018-03-19 DIAGNOSIS — E039 Hypothyroidism, unspecified: Secondary | ICD-10-CM | POA: Diagnosis not present

## 2018-04-26 ENCOUNTER — Ambulatory Visit (AMBULATORY_SURGERY_CENTER): Payer: Self-pay

## 2018-04-26 ENCOUNTER — Other Ambulatory Visit: Payer: Self-pay

## 2018-04-26 VITALS — Ht 71.0 in | Wt 166.2 lb

## 2018-04-26 DIAGNOSIS — K222 Esophageal obstruction: Secondary | ICD-10-CM

## 2018-04-26 NOTE — Progress Notes (Signed)
No egg or soy allergy known to patient  No issues with past sedation with any surgeries  or procedures, no intubation problems  No diet pills per patient No home 02 use per patient  No blood thinners per patient  Pt denies issues with constipation  No A fib or A flutter  EMMI video sent to pt's e mail , declined  

## 2018-04-30 ENCOUNTER — Encounter: Payer: Self-pay | Admitting: Internal Medicine

## 2018-04-30 ENCOUNTER — Ambulatory Visit (AMBULATORY_SURGERY_CENTER): Payer: Medicare Other | Admitting: Internal Medicine

## 2018-04-30 VITALS — BP 108/66 | HR 68 | Temp 98.6°F | Resp 13 | Ht 71.0 in | Wt 166.0 lb

## 2018-04-30 DIAGNOSIS — K222 Esophageal obstruction: Secondary | ICD-10-CM

## 2018-04-30 DIAGNOSIS — K200 Eosinophilic esophagitis: Secondary | ICD-10-CM | POA: Diagnosis not present

## 2018-04-30 HISTORY — DX: Eosinophilic esophagitis: K20.0

## 2018-04-30 MED ORDER — SODIUM CHLORIDE 0.9 % IV SOLN: 500.0000 mL | Freq: Once | INTRAVENOUS | Status: DC

## 2018-04-30 NOTE — Patient Instructions (Addendum)
I dilated to 20 mm today. That went well.  The biopsies last time indicated Eosinophilic esophagitis. In eosinophilic esophagitis (e-o-sin-o-FILL-ik uh-sof-uh-JIE-tis), a type of white blood cell (eosinophil) builds up in the lining of the tube that connects your mouth to your stomach (esophagus). This buildup, which is a reaction to foods, allergens or acid reflux, can inflame or injure the esophageal tissue. Damaged esophageal tissue can lead to difficulty swallowing or cause food to get caught when you swallow.  Eosinophilic esophagitis is a chronic immune system disease. It has been identified only in the past two decades, but is now considered a major cause of digestive system (gastrointestinal) illness. Research is ongoing and will likely lead to revisions in its diagnosis and treatment.  Hard to find food allergies though implicated. PPI treatment (Prilosec) effectively treats this in many cases.  Please stay on that.  See me as needed.  I appreciate the opportunity to care for you. Gatha Mayer, MD, FACG  YOU HAD AN ENDOSCOPIC PROCEDURE TODAY AT [facility]:   Refer to the procedure report that was given to you for any specific questions about what was found during the examination.  If the procedure report does not answer your questions, please call your gastroenterologist to clarify.  If you requested that your care partner not be given the details of your procedure findings, then the procedure report has been included in a sealed envelope for you to review at your convenience later.  YOU SHOULD EXPECT: Some feelings of bloating in the abdomen. Passage of more gas than usual.  Walking can help get rid of the air that was put into your GI tract during the procedure and reduce the bloating. If you had a lower endoscopy (such as a colonoscopy or flexible sigmoidoscopy) you may notice spotting of blood in your stool or on the toilet paper. If you underwent a  bowel prep for your procedure, you may not have a normal bowel movement for a few days.  Please Note:  You might notice some irritation and congestion in your nose or some drainage.  This is from the oxygen used during your procedure.  There is no need for concern and it should clear up in a day or so.  SYMPTOMS TO REPORT IMMEDIATELY:    Following upper endoscopy (EGD)  Vomiting of blood or coffee ground material  New chest pain or pain under the shoulder blades  Painful or persistently difficult swallowing  New shortness of breath  Fever of 100F or higher  Black, tarry-looking stools  For urgent or emergent issues, a gastroenterologist can be reached at any hour by calling [phone].   DIET: Nothing by mouth until 11:15, clear liquids until 12:15, soft foods the rest of the day. Tommorrow you may proceed to your regular diet.  Drink plenty of fluids but you should avoid alcoholic beverages for 24 hours.  ACTIVITY:  You should plan to take it easy for the rest of today and you should NOT DRIVE or use heavy machinery until tomorrow (because of the sedation medicines used during the test).    FOLLOW UP: Our staff will call the number listed on your records the next business day following your procedure to check on you and address any questions or concerns that you may have regarding the information given to you following your procedure. If we do not reach you, we will leave a message.  However, if you are feeling well and you are not  experiencing any problems, there is no need to return our call.  We will assume that you have returned to your regular daily activities without incident.  If any biopsies were taken you will be contacted by phone or by letter within the next 1-3 weeks.  Please call us at [phone] if you have not heard about the biopsies in 3 weeks.    SIGNATURES/CONFIDENTIALITY: You and/or your care partner have signed paperwork which will be entered into your  electronic medical record.  These signatures attest to the fact that that the information above on your After Visit Summary has been reviewed and is understood.  Full responsibility of the confidentiality of this discharge information lies with you and/or your care-partner.

## 2018-04-30 NOTE — Progress Notes (Signed)
Pt's states no medical or surgical changes since previsit or office visit. 

## 2018-04-30 NOTE — Progress Notes (Signed)
Called to room to assist during endoscopic procedure.  Patient ID and intended procedure confirmed with present staff. Received instructions for my participation in the procedure from the performing physician.  

## 2018-04-30 NOTE — Progress Notes (Signed)
Spontaneous respirations throughout. VSS. Resting comfortably. To PACU on room air. Report to  RN. 

## 2018-04-30 NOTE — Op Note (Signed)
[facility] [place] Patient Name: George Ford Procedure Date: 04/30/2018 9:56 AM MRN: [id number] Endoscopist: Gatha Mayer , MD Age: 72 Referring MD:  Date of Birth: 07-11-1946 Gender: Male Account #: [id number] Procedure:                Upper GI endoscopy Indications:              Therapeutic procedure, Stricture of the esophagus,                            For therapy of esophageal stricture Medicines:                Propofol per Anesthesia, Monitored Anesthesia Care Procedure:                Pre-Anesthesia Assessment:                           - Prior to the procedure, a History and Physical                            was performed, and patient medications and                            allergies were reviewed. The patient's tolerance of                            previous anesthesia was also reviewed. The risks                            and benefits of the procedure and the sedation                            options and risks were discussed with the patient.                            All questions were answered, and informed consent                            was obtained. Prior Anticoagulants: The patient has                            taken no previous anticoagulant or antiplatelet                            agents. ASA Grade Assessment: II - A patient with                            mild systemic disease. After reviewing the risks                            and benefits, the patient was deemed in                            satisfactory condition to undergo the procedure.  After obtaining informed consent, the endoscope was                            passed under direct vision. Throughout the                            procedure, the patient's blood pressure, pulse, and                            oxygen saturations were monitored continuously. The                            Endoscope was introduced through the mouth, and      advanced to the antrum of the stomach. The upper GI                            endoscopy was accomplished without difficulty. The                            patient tolerated the procedure well. Scope In: Scope Out: Findings:                 One benign-appearing, intrinsic moderate                            (circumferential scarring or stenosis; an endoscope                            may pass) stenosis was found at the                            gastroesophageal junction. This stenosis measured                            1.7 cm (inner diameter). The stenosis was                            traversed. A TTS dilator was passed through the                            scope. Dilation with an 18-19-20 mm balloon dilator                            was performed to 20 mm. The dilation site was                            examined and showed mild mucosal disruption.                            Estimated blood loss was minimal.                           Mucosal changes including longitudinal furrows were  found in the entire esophagus. Esophageal findings                            were graded using the Eosinophilic Esophagitis                            Endoscopic Reference Score (EoE-EREFS) as: Edema                            Grade 1 Present (decreased clarity or absence of                            vascular markings), Rings Grade 0 None (no ridges                            or rings seen), Exudates Grade 0 None (no white                            lesions seen), Furrows Grade 1 Present (vertical                            lines with or without visible depth) and Stricture                            present (1.7 cm luminal diameter).                           A 4 cm hiatal hernia was present.                           The exam was otherwise without abnormality.                           The cardia and gastric fundus were normal on                             retroflexion. Complications:            No immediate complications. Estimated Blood Loss:     Estimated blood loss was minimal. Impression:               - Benign-appearing esophageal stenosis. Dilated.                           - Esophageal mucosal changes secondary to                            eosinophilic esophagitis.                           - 4 cm hiatal hernia.                           - The examination was otherwise normal.                           -  No specimens collected. Recommendation:           - Patient has a contact number available for                            emergencies. The signs and symptoms of potential                            delayed complications were discussed with the                            patient. Return to normal activities tomorrow.                            Written discharge instructions were provided to the                            patient.                           - Clear liquids x 1 hour then soft foods rest of                            day. Start prior diet tomorrow.                           - Continue present medications.                           - Repeat dilation as needed                           Stay on PPi to treat Eosinophilic esophagitis Gatha Mayer, MD 04/30/2018 10:22:38 AM This report has been signed electronically.

## 2018-05-01 ENCOUNTER — Telehealth: Payer: Self-pay | Admitting: *Deleted

## 2018-05-01 NOTE — Telephone Encounter (Signed)
  Follow up Call-  Call back number 04/30/2018 03/13/2018  Post procedure Call Back phone  # [phone]  Permission to leave phone message Yes Yes  Some recent data might be hidden     Patient questions:  Do you have a fever, pain , or abdominal swelling? No. Pain Score  0 *  Have you tolerated food without any problems? Yes.    Have you been able to return to your normal activities? Yes.    Do you have any questions about your discharge instructions: Diet   No. Medications  No. Follow up visit  No.  Do you have questions or concerns about your Care? No.  Actions: * If pain score is 4 or above: No action needed, pain <4.

## 2018-06-17 DIAGNOSIS — E039 Hypothyroidism, unspecified: Secondary | ICD-10-CM | POA: Diagnosis not present

## 2018-06-17 DIAGNOSIS — E119 Type 2 diabetes mellitus without complications: Secondary | ICD-10-CM | POA: Diagnosis not present

## 2018-06-17 DIAGNOSIS — E118 Type 2 diabetes mellitus with unspecified complications: Secondary | ICD-10-CM | POA: Diagnosis not present

## 2018-06-24 DIAGNOSIS — E119 Type 2 diabetes mellitus without complications: Secondary | ICD-10-CM | POA: Diagnosis not present

## 2018-06-24 DIAGNOSIS — E039 Hypothyroidism, unspecified: Secondary | ICD-10-CM | POA: Diagnosis not present

## 2018-06-24 DIAGNOSIS — Z85850 Personal history of malignant neoplasm of thyroid: Secondary | ICD-10-CM | POA: Diagnosis not present

## 2018-09-12 DIAGNOSIS — N138 Other obstructive and reflux uropathy: Secondary | ICD-10-CM | POA: Diagnosis not present

## 2018-09-12 DIAGNOSIS — N401 Enlarged prostate with lower urinary tract symptoms: Secondary | ICD-10-CM | POA: Diagnosis not present

## 2018-09-17 DIAGNOSIS — N401 Enlarged prostate with lower urinary tract symptoms: Secondary | ICD-10-CM | POA: Diagnosis not present

## 2018-09-17 DIAGNOSIS — Z794 Long term (current) use of insulin: Secondary | ICD-10-CM | POA: Diagnosis not present

## 2018-09-17 DIAGNOSIS — E119 Type 2 diabetes mellitus without complications: Secondary | ICD-10-CM | POA: Diagnosis not present

## 2018-09-17 DIAGNOSIS — N138 Other obstructive and reflux uropathy: Secondary | ICD-10-CM | POA: Diagnosis not present

## 2018-09-18 DIAGNOSIS — E119 Type 2 diabetes mellitus without complications: Secondary | ICD-10-CM | POA: Diagnosis not present

## 2018-10-15 DIAGNOSIS — E039 Hypothyroidism, unspecified: Secondary | ICD-10-CM | POA: Diagnosis not present

## 2018-10-15 DIAGNOSIS — Z85850 Personal history of malignant neoplasm of thyroid: Secondary | ICD-10-CM | POA: Diagnosis not present

## 2018-10-15 DIAGNOSIS — E119 Type 2 diabetes mellitus without complications: Secondary | ICD-10-CM | POA: Diagnosis not present

## 2018-10-28 DIAGNOSIS — E039 Hypothyroidism, unspecified: Secondary | ICD-10-CM | POA: Diagnosis not present

## 2018-10-28 DIAGNOSIS — Z85850 Personal history of malignant neoplasm of thyroid: Secondary | ICD-10-CM | POA: Diagnosis not present

## 2018-10-28 DIAGNOSIS — Z6825 Body mass index (BMI) 25.0-25.9, adult: Secondary | ICD-10-CM | POA: Diagnosis not present

## 2018-10-28 DIAGNOSIS — E119 Type 2 diabetes mellitus without complications: Secondary | ICD-10-CM | POA: Diagnosis not present

## 2019-01-30 DIAGNOSIS — E7800 Pure hypercholesterolemia, unspecified: Secondary | ICD-10-CM | POA: Diagnosis not present

## 2019-01-30 DIAGNOSIS — Z125 Encounter for screening for malignant neoplasm of prostate: Secondary | ICD-10-CM | POA: Diagnosis not present

## 2019-01-30 DIAGNOSIS — E119 Type 2 diabetes mellitus without complications: Secondary | ICD-10-CM | POA: Diagnosis not present

## 2019-02-04 DIAGNOSIS — E118 Type 2 diabetes mellitus with unspecified complications: Secondary | ICD-10-CM | POA: Diagnosis not present

## 2019-02-04 DIAGNOSIS — Z0000 Encounter for general adult medical examination without abnormal findings: Secondary | ICD-10-CM | POA: Diagnosis not present

## 2019-02-04 DIAGNOSIS — E032 Hypothyroidism due to medicaments and other exogenous substances: Secondary | ICD-10-CM | POA: Diagnosis not present

## 2019-02-04 DIAGNOSIS — K219 Gastro-esophageal reflux disease without esophagitis: Secondary | ICD-10-CM | POA: Diagnosis not present

## 2019-02-04 DIAGNOSIS — E7800 Pure hypercholesterolemia, unspecified: Secondary | ICD-10-CM | POA: Diagnosis not present

## 2019-02-04 DIAGNOSIS — Z9889 Other specified postprocedural states: Secondary | ICD-10-CM | POA: Diagnosis not present

## 2019-02-04 DIAGNOSIS — Z85850 Personal history of malignant neoplasm of thyroid: Secondary | ICD-10-CM | POA: Diagnosis not present

## 2019-02-04 DIAGNOSIS — N401 Enlarged prostate with lower urinary tract symptoms: Secondary | ICD-10-CM | POA: Diagnosis not present

## 2019-02-04 DIAGNOSIS — Z23 Encounter for immunization: Secondary | ICD-10-CM | POA: Diagnosis not present

## 2019-02-26 ENCOUNTER — Ambulatory Visit (INDEPENDENT_AMBULATORY_CARE_PROVIDER_SITE_OTHER): Payer: Medicare Other | Admitting: Physician Assistant

## 2019-02-26 ENCOUNTER — Encounter: Payer: Self-pay | Admitting: Physician Assistant

## 2019-02-26 ENCOUNTER — Other Ambulatory Visit: Payer: Self-pay

## 2019-02-26 DIAGNOSIS — M7701 Medial epicondylitis, right elbow: Secondary | ICD-10-CM

## 2019-02-26 MED ORDER — LIDOCAINE HCL 1 % IJ SOLN
3.0000 mL | INTRAMUSCULAR | Status: AC | PRN
  Administered 2019-02-26: 3 mL

## 2019-02-26 MED ORDER — METHYLPREDNISOLONE ACETATE 40 MG/ML IJ SUSP
40.0000 mg | INTRAMUSCULAR | Status: AC | PRN
  Administered 2019-02-26: 40 mg

## 2019-02-26 NOTE — Progress Notes (Signed)
   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  He has had no particular injury.  Denies any swelling or redness.  Denies any fevers chills.  He states that his affecting his golf game.  He is a very avid golfer.  He is wanting to participate in the senior golf tournament in the coming weeks.  Denies any numbness tingling down the arm. Review of systems: See HPI otherwise negative  Physical exam: General well-developed well-nourished male no acute distress mood affect appropriate.  Psych oriented x3. Right upper extremity has good range of motion of the elbow without pain.  Full supination pronation.  Radial pulses intact.  Sensation grossly intact throughout the hand.  Tenderness over the medial epicondyle to a lesser degree over the lateral epicondyle region.  There is no rashes skin lesions ulcerations erythema or ecchymosis about the right elbow.  Procedures: Visit Diagnoses: Medial epicondylitis of elbow, right  Hand/UE Inj: R elbow for medial epicondylitis on 02/26/2019 12:13 PM Details: medial approach Medications: 3 mL lidocaine 1 %; 40 mg methylPREDNISolone acetate 40 MG/ML Immediately prior to procedure a time out was called to verify the correct patient, procedure, equipment, support staff and site/side marked as required. Patient was prepped and draped in the usual sterile fashion.     Plan: He is shown some lifting techniques.  Also discussed with him loosening his grip.  Recommend ice the elbow.  Discussed formal physical therapy he wanted a injection and therefore we did perform an injection today and he tolerated this well without incident.  He can also apply the topical gel to this area such as diclofenac which she can buy over-the-counter now discussed this with him at length.  He will follow-up on an as-needed basis pain  persist or becomes worse.  Questions encouraged and answered.

## 2019-03-03 ENCOUNTER — Other Ambulatory Visit: Payer: Self-pay | Admitting: Internal Medicine

## 2019-04-11 ENCOUNTER — Other Ambulatory Visit: Payer: Self-pay

## 2019-04-21 DIAGNOSIS — E118 Type 2 diabetes mellitus with unspecified complications: Secondary | ICD-10-CM | POA: Diagnosis not present

## 2019-04-21 DIAGNOSIS — E119 Type 2 diabetes mellitus without complications: Secondary | ICD-10-CM | POA: Diagnosis not present

## 2019-04-21 DIAGNOSIS — Z85850 Personal history of malignant neoplasm of thyroid: Secondary | ICD-10-CM | POA: Diagnosis not present

## 2019-04-28 DIAGNOSIS — Z6825 Body mass index (BMI) 25.0-25.9, adult: Secondary | ICD-10-CM | POA: Diagnosis not present

## 2019-04-28 DIAGNOSIS — E039 Hypothyroidism, unspecified: Secondary | ICD-10-CM | POA: Diagnosis not present

## 2019-04-28 DIAGNOSIS — Z85850 Personal history of malignant neoplasm of thyroid: Secondary | ICD-10-CM | POA: Diagnosis not present

## 2019-04-28 DIAGNOSIS — Z7189 Other specified counseling: Secondary | ICD-10-CM | POA: Diagnosis not present

## 2019-04-28 DIAGNOSIS — E119 Type 2 diabetes mellitus without complications: Secondary | ICD-10-CM | POA: Diagnosis not present

## 2019-06-06 ENCOUNTER — Other Ambulatory Visit: Payer: Self-pay | Admitting: Internal Medicine

## 2019-08-31 ENCOUNTER — Other Ambulatory Visit: Payer: Self-pay | Admitting: Internal Medicine

## 2019-10-27 DIAGNOSIS — E039 Hypothyroidism, unspecified: Secondary | ICD-10-CM | POA: Diagnosis not present

## 2019-10-27 DIAGNOSIS — E119 Type 2 diabetes mellitus without complications: Secondary | ICD-10-CM | POA: Diagnosis not present

## 2019-10-27 DIAGNOSIS — E118 Type 2 diabetes mellitus with unspecified complications: Secondary | ICD-10-CM | POA: Diagnosis not present

## 2019-10-27 DIAGNOSIS — Z79899 Other long term (current) drug therapy: Secondary | ICD-10-CM | POA: Diagnosis not present

## 2019-10-29 DIAGNOSIS — Z6825 Body mass index (BMI) 25.0-25.9, adult: Secondary | ICD-10-CM | POA: Diagnosis not present

## 2019-10-29 DIAGNOSIS — Z85850 Personal history of malignant neoplasm of thyroid: Secondary | ICD-10-CM | POA: Diagnosis not present

## 2019-10-29 DIAGNOSIS — E119 Type 2 diabetes mellitus without complications: Secondary | ICD-10-CM | POA: Diagnosis not present

## 2019-10-29 DIAGNOSIS — Z7189 Other specified counseling: Secondary | ICD-10-CM | POA: Diagnosis not present

## 2019-10-29 DIAGNOSIS — E039 Hypothyroidism, unspecified: Secondary | ICD-10-CM | POA: Diagnosis not present

## 2019-11-25 ENCOUNTER — Other Ambulatory Visit: Payer: Self-pay | Admitting: Internal Medicine

## 2019-12-05 DIAGNOSIS — M7701 Medial epicondylitis, right elbow: Secondary | ICD-10-CM | POA: Diagnosis not present

## 2019-12-05 DIAGNOSIS — M5030 Other cervical disc degeneration, unspecified cervical region: Secondary | ICD-10-CM | POA: Diagnosis not present

## 2020-02-05 DIAGNOSIS — Z125 Encounter for screening for malignant neoplasm of prostate: Secondary | ICD-10-CM | POA: Diagnosis not present

## 2020-02-05 DIAGNOSIS — E039 Hypothyroidism, unspecified: Secondary | ICD-10-CM | POA: Diagnosis not present

## 2020-02-05 DIAGNOSIS — E119 Type 2 diabetes mellitus without complications: Secondary | ICD-10-CM | POA: Diagnosis not present

## 2020-02-05 DIAGNOSIS — E7800 Pure hypercholesterolemia, unspecified: Secondary | ICD-10-CM | POA: Diagnosis not present

## 2020-02-27 ENCOUNTER — Other Ambulatory Visit: Payer: Self-pay | Admitting: Internal Medicine

## 2020-03-30 DIAGNOSIS — H5203 Hypermetropia, bilateral: Secondary | ICD-10-CM | POA: Diagnosis not present

## 2020-03-30 DIAGNOSIS — H52203 Unspecified astigmatism, bilateral: Secondary | ICD-10-CM | POA: Diagnosis not present

## 2020-03-30 DIAGNOSIS — E119 Type 2 diabetes mellitus without complications: Secondary | ICD-10-CM | POA: Diagnosis not present

## 2020-03-30 DIAGNOSIS — H2513 Age-related nuclear cataract, bilateral: Secondary | ICD-10-CM | POA: Diagnosis not present

## 2020-04-26 DIAGNOSIS — Z79899 Other long term (current) drug therapy: Secondary | ICD-10-CM | POA: Diagnosis not present

## 2020-04-26 DIAGNOSIS — E119 Type 2 diabetes mellitus without complications: Secondary | ICD-10-CM | POA: Diagnosis not present

## 2020-04-28 DIAGNOSIS — E119 Type 2 diabetes mellitus without complications: Secondary | ICD-10-CM | POA: Diagnosis not present

## 2020-04-28 DIAGNOSIS — E039 Hypothyroidism, unspecified: Secondary | ICD-10-CM | POA: Diagnosis not present

## 2020-04-28 DIAGNOSIS — Z6825 Body mass index (BMI) 25.0-25.9, adult: Secondary | ICD-10-CM | POA: Diagnosis not present

## 2020-04-28 DIAGNOSIS — Z85850 Personal history of malignant neoplasm of thyroid: Secondary | ICD-10-CM | POA: Diagnosis not present

## 2020-05-26 ENCOUNTER — Other Ambulatory Visit: Payer: Self-pay | Admitting: Internal Medicine

## 2020-07-07 DIAGNOSIS — Z03818 Encounter for observation for suspected exposure to other biological agents ruled out: Secondary | ICD-10-CM | POA: Diagnosis not present

## 2020-07-07 DIAGNOSIS — Z20822 Contact with and (suspected) exposure to covid-19: Secondary | ICD-10-CM | POA: Diagnosis not present

## 2020-08-17 ENCOUNTER — Other Ambulatory Visit: Payer: Self-pay | Admitting: Internal Medicine

## 2020-10-21 DIAGNOSIS — E039 Hypothyroidism, unspecified: Secondary | ICD-10-CM | POA: Diagnosis not present

## 2020-10-21 DIAGNOSIS — Z85850 Personal history of malignant neoplasm of thyroid: Secondary | ICD-10-CM | POA: Diagnosis not present

## 2020-10-21 DIAGNOSIS — M5137 Other intervertebral disc degeneration, lumbosacral region: Secondary | ICD-10-CM | POA: Diagnosis not present

## 2020-10-21 DIAGNOSIS — M9905 Segmental and somatic dysfunction of pelvic region: Secondary | ICD-10-CM | POA: Diagnosis not present

## 2020-10-21 DIAGNOSIS — M9903 Segmental and somatic dysfunction of lumbar region: Secondary | ICD-10-CM | POA: Diagnosis not present

## 2020-10-21 DIAGNOSIS — M25551 Pain in right hip: Secondary | ICD-10-CM | POA: Diagnosis not present

## 2020-10-21 DIAGNOSIS — E119 Type 2 diabetes mellitus without complications: Secondary | ICD-10-CM | POA: Diagnosis not present

## 2020-10-25 DIAGNOSIS — M5137 Other intervertebral disc degeneration, lumbosacral region: Secondary | ICD-10-CM | POA: Diagnosis not present

## 2020-10-25 DIAGNOSIS — M25551 Pain in right hip: Secondary | ICD-10-CM | POA: Diagnosis not present

## 2020-10-25 DIAGNOSIS — M9903 Segmental and somatic dysfunction of lumbar region: Secondary | ICD-10-CM | POA: Diagnosis not present

## 2020-10-25 DIAGNOSIS — M9905 Segmental and somatic dysfunction of pelvic region: Secondary | ICD-10-CM | POA: Diagnosis not present

## 2020-10-27 DIAGNOSIS — M9905 Segmental and somatic dysfunction of pelvic region: Secondary | ICD-10-CM | POA: Diagnosis not present

## 2020-10-27 DIAGNOSIS — M9903 Segmental and somatic dysfunction of lumbar region: Secondary | ICD-10-CM | POA: Diagnosis not present

## 2020-10-27 DIAGNOSIS — M25551 Pain in right hip: Secondary | ICD-10-CM | POA: Diagnosis not present

## 2020-10-27 DIAGNOSIS — M5137 Other intervertebral disc degeneration, lumbosacral region: Secondary | ICD-10-CM | POA: Diagnosis not present

## 2020-10-28 DIAGNOSIS — Z85850 Personal history of malignant neoplasm of thyroid: Secondary | ICD-10-CM | POA: Diagnosis not present

## 2020-10-28 DIAGNOSIS — E119 Type 2 diabetes mellitus without complications: Secondary | ICD-10-CM | POA: Diagnosis not present

## 2020-10-28 DIAGNOSIS — E039 Hypothyroidism, unspecified: Secondary | ICD-10-CM | POA: Diagnosis not present

## 2020-10-28 DIAGNOSIS — Z6825 Body mass index (BMI) 25.0-25.9, adult: Secondary | ICD-10-CM | POA: Diagnosis not present

## 2020-11-03 DIAGNOSIS — M9903 Segmental and somatic dysfunction of lumbar region: Secondary | ICD-10-CM | POA: Diagnosis not present

## 2020-11-03 DIAGNOSIS — M9905 Segmental and somatic dysfunction of pelvic region: Secondary | ICD-10-CM | POA: Diagnosis not present

## 2020-11-03 DIAGNOSIS — M5137 Other intervertebral disc degeneration, lumbosacral region: Secondary | ICD-10-CM | POA: Diagnosis not present

## 2020-11-03 DIAGNOSIS — M25551 Pain in right hip: Secondary | ICD-10-CM | POA: Diagnosis not present

## 2020-11-10 DIAGNOSIS — M5137 Other intervertebral disc degeneration, lumbosacral region: Secondary | ICD-10-CM | POA: Diagnosis not present

## 2020-11-10 DIAGNOSIS — M25551 Pain in right hip: Secondary | ICD-10-CM | POA: Diagnosis not present

## 2020-11-10 DIAGNOSIS — M9903 Segmental and somatic dysfunction of lumbar region: Secondary | ICD-10-CM | POA: Diagnosis not present

## 2020-11-10 DIAGNOSIS — M9905 Segmental and somatic dysfunction of pelvic region: Secondary | ICD-10-CM | POA: Diagnosis not present

## 2020-11-21 ENCOUNTER — Other Ambulatory Visit: Payer: Self-pay | Admitting: Internal Medicine

## 2021-02-25 ENCOUNTER — Other Ambulatory Visit: Payer: Self-pay | Admitting: Internal Medicine

## 2021-03-23 DIAGNOSIS — Z125 Encounter for screening for malignant neoplasm of prostate: Secondary | ICD-10-CM | POA: Diagnosis not present

## 2021-03-23 DIAGNOSIS — E119 Type 2 diabetes mellitus without complications: Secondary | ICD-10-CM | POA: Diagnosis not present

## 2021-03-23 DIAGNOSIS — E7800 Pure hypercholesterolemia, unspecified: Secondary | ICD-10-CM | POA: Diagnosis not present

## 2021-03-28 ENCOUNTER — Other Ambulatory Visit: Payer: Self-pay | Admitting: Internal Medicine

## 2021-03-28 DIAGNOSIS — Z23 Encounter for immunization: Secondary | ICD-10-CM | POA: Diagnosis not present

## 2021-03-28 DIAGNOSIS — E118 Type 2 diabetes mellitus with unspecified complications: Secondary | ICD-10-CM | POA: Diagnosis not present

## 2021-03-28 DIAGNOSIS — K219 Gastro-esophageal reflux disease without esophagitis: Secondary | ICD-10-CM | POA: Diagnosis not present

## 2021-03-28 DIAGNOSIS — E7800 Pure hypercholesterolemia, unspecified: Secondary | ICD-10-CM

## 2021-03-28 DIAGNOSIS — Z1212 Encounter for screening for malignant neoplasm of rectum: Secondary | ICD-10-CM | POA: Diagnosis not present

## 2021-03-28 DIAGNOSIS — E032 Hypothyroidism due to medicaments and other exogenous substances: Secondary | ICD-10-CM | POA: Diagnosis not present

## 2021-03-28 DIAGNOSIS — Z0000 Encounter for general adult medical examination without abnormal findings: Secondary | ICD-10-CM | POA: Diagnosis not present

## 2021-03-28 DIAGNOSIS — R350 Frequency of micturition: Secondary | ICD-10-CM | POA: Diagnosis not present

## 2021-03-28 DIAGNOSIS — N401 Enlarged prostate with lower urinary tract symptoms: Secondary | ICD-10-CM | POA: Diagnosis not present

## 2021-04-01 DIAGNOSIS — E119 Type 2 diabetes mellitus without complications: Secondary | ICD-10-CM | POA: Diagnosis not present

## 2021-04-20 DIAGNOSIS — H35372 Puckering of macula, left eye: Secondary | ICD-10-CM | POA: Diagnosis not present

## 2021-04-20 DIAGNOSIS — H43813 Vitreous degeneration, bilateral: Secondary | ICD-10-CM | POA: Diagnosis not present

## 2021-04-20 DIAGNOSIS — H3581 Retinal edema: Secondary | ICD-10-CM | POA: Diagnosis not present

## 2021-04-20 DIAGNOSIS — H2513 Age-related nuclear cataract, bilateral: Secondary | ICD-10-CM | POA: Diagnosis not present

## 2021-04-27 ENCOUNTER — Other Ambulatory Visit: Payer: Self-pay

## 2021-04-27 ENCOUNTER — Ambulatory Visit
Admission: RE | Admit: 2021-04-27 | Discharge: 2021-04-27 | Disposition: A | Discharge status: Home / Self Care | Payer: No Typology Code available for payment source | Source: Ambulatory Visit | Attending: Internal Medicine | Admitting: Internal Medicine

## 2021-04-27 DIAGNOSIS — E7800 Pure hypercholesterolemia, unspecified: Secondary | ICD-10-CM

## 2021-05-02 DIAGNOSIS — R918 Other nonspecific abnormal finding of lung field: Secondary | ICD-10-CM | POA: Diagnosis not present

## 2021-05-02 DIAGNOSIS — I2584 Coronary atherosclerosis due to calcified coronary lesion: Secondary | ICD-10-CM | POA: Diagnosis not present

## 2021-05-02 DIAGNOSIS — I2510 Atherosclerotic heart disease of native coronary artery without angina pectoris: Secondary | ICD-10-CM | POA: Diagnosis not present

## 2021-05-12 HISTORY — PX: RETINAL DETACHMENT SURGERY: SHX105

## 2021-05-26 DIAGNOSIS — H2513 Age-related nuclear cataract, bilateral: Secondary | ICD-10-CM | POA: Diagnosis not present

## 2021-05-26 DIAGNOSIS — H2512 Age-related nuclear cataract, left eye: Secondary | ICD-10-CM | POA: Diagnosis not present

## 2021-05-26 DIAGNOSIS — H35372 Puckering of macula, left eye: Secondary | ICD-10-CM | POA: Diagnosis not present

## 2021-05-26 DIAGNOSIS — H18413 Arcus senilis, bilateral: Secondary | ICD-10-CM | POA: Diagnosis not present

## 2021-05-26 DIAGNOSIS — H25043 Posterior subcapsular polar age-related cataract, bilateral: Secondary | ICD-10-CM | POA: Diagnosis not present

## 2021-05-26 DIAGNOSIS — H25013 Cortical age-related cataract, bilateral: Secondary | ICD-10-CM | POA: Diagnosis not present

## 2021-05-30 DIAGNOSIS — H35372 Puckering of macula, left eye: Secondary | ICD-10-CM | POA: Diagnosis not present

## 2021-05-30 DIAGNOSIS — H2512 Age-related nuclear cataract, left eye: Secondary | ICD-10-CM | POA: Diagnosis not present

## 2021-05-30 DIAGNOSIS — H3581 Retinal edema: Secondary | ICD-10-CM | POA: Diagnosis not present

## 2021-06-01 ENCOUNTER — Other Ambulatory Visit: Payer: Self-pay | Admitting: Internal Medicine

## 2021-06-07 DIAGNOSIS — H3581 Retinal edema: Secondary | ICD-10-CM | POA: Diagnosis not present

## 2021-06-07 DIAGNOSIS — H35372 Puckering of macula, left eye: Secondary | ICD-10-CM | POA: Diagnosis not present

## 2021-06-28 DIAGNOSIS — H35372 Puckering of macula, left eye: Secondary | ICD-10-CM | POA: Diagnosis not present

## 2021-06-28 DIAGNOSIS — H3581 Retinal edema: Secondary | ICD-10-CM | POA: Diagnosis not present

## 2021-07-26 DIAGNOSIS — E119 Type 2 diabetes mellitus without complications: Secondary | ICD-10-CM | POA: Diagnosis not present

## 2021-07-26 DIAGNOSIS — Z6825 Body mass index (BMI) 25.0-25.9, adult: Secondary | ICD-10-CM | POA: Diagnosis not present

## 2021-07-26 DIAGNOSIS — E039 Hypothyroidism, unspecified: Secondary | ICD-10-CM | POA: Diagnosis not present

## 2021-07-26 DIAGNOSIS — Z85850 Personal history of malignant neoplasm of thyroid: Secondary | ICD-10-CM | POA: Diagnosis not present

## 2021-08-28 ENCOUNTER — Other Ambulatory Visit: Payer: Self-pay | Admitting: Internal Medicine

## 2021-09-19 DIAGNOSIS — E119 Type 2 diabetes mellitus without complications: Secondary | ICD-10-CM | POA: Diagnosis not present

## 2021-09-20 DIAGNOSIS — M545 Low back pain, unspecified: Secondary | ICD-10-CM | POA: Diagnosis not present

## 2021-09-20 DIAGNOSIS — M47816 Spondylosis without myelopathy or radiculopathy, lumbar region: Secondary | ICD-10-CM | POA: Diagnosis not present

## 2021-09-20 DIAGNOSIS — R030 Elevated blood-pressure reading, without diagnosis of hypertension: Secondary | ICD-10-CM | POA: Diagnosis not present

## 2021-10-04 DIAGNOSIS — M47816 Spondylosis without myelopathy or radiculopathy, lumbar region: Secondary | ICD-10-CM | POA: Diagnosis not present

## 2021-10-25 DIAGNOSIS — M47816 Spondylosis without myelopathy or radiculopathy, lumbar region: Secondary | ICD-10-CM | POA: Diagnosis not present

## 2021-11-13 ENCOUNTER — Other Ambulatory Visit: Payer: Self-pay | Admitting: Internal Medicine

## 2021-11-16 DIAGNOSIS — M47816 Spondylosis without myelopathy or radiculopathy, lumbar region: Secondary | ICD-10-CM | POA: Diagnosis not present

## 2021-12-07 ENCOUNTER — Encounter: Payer: Self-pay | Admitting: Internal Medicine

## 2021-12-07 ENCOUNTER — Ambulatory Visit (INDEPENDENT_AMBULATORY_CARE_PROVIDER_SITE_OTHER): Payer: Medicare Other | Admitting: Internal Medicine

## 2021-12-07 VITALS — BP 122/78 | HR 64 | Ht 72.0 in | Wt 167.6 lb

## 2021-12-07 DIAGNOSIS — K200 Eosinophilic esophagitis: Secondary | ICD-10-CM

## 2021-12-07 DIAGNOSIS — K222 Esophageal obstruction: Secondary | ICD-10-CM

## 2021-12-07 MED ORDER — OMEPRAZOLE 40 MG PO CPDR: 40.0000 mg | DELAYED_RELEASE_CAPSULE | Freq: Every day | ORAL | 3 refills | Status: DC

## 2021-12-07 NOTE — Patient Instructions (Signed)
If you are age 76 or older, your body mass index should be between 23-30. Your Body mass index is 22.73 kg/m?Marland Kitchen If this is out of the aforementioned range listed, please consider follow up with your Primary Care Provider. ? ?If you are age 76 or younger, your body mass index should be between 19-25. Your Body mass index is 22.73 kg/m?Marland Kitchen If this is out of the aformentioned range listed, please consider follow up with your Primary Care Provider.  ? ?________________________________________________________ ? ?The [facility] providers would like to encourage you to use [facility] to communicate with providers for non-urgent requests or questions.  Due to long hold times on the telephone, sending your provider a message by [facility] may be a faster and more efficient way to get a response.  Please allow 48 business hours for a response.  Please remember that this is for non-urgent requests.  ?_______________________________________________________ ? ?We have sent the following medications to your pharmacy for you to pick up at your convenience: ?Omeprazole  ? ?I appreciate the opportunity to care for you. ?Silvano Rusk, MD, [facility] ?

## 2021-12-07 NOTE — Progress Notes (Signed)
? ?  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 [birth year] [id number] ? ?Assessment & Plan:  ? ?Encounter Diagnoses  ?Name Primary?  ? Esophageal stricture Yes  ? Eosinophilic esophagitis   ? ? ? ?Doing well on chronic PPI.  Given his problems with dysphagia and suspected eosinophilic esophagitis would continue.  He will consider getting refills through primary care rather than having to have a specialty visit every 2 years.  Follow-up as needed otherwise. ? ?Subjective:  ? ?Chief Complaint: History of esophageal stricture thought related to eosinophilic esophagitis, versus GERD.  Doing well needs refill ? 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  Pathology raise the question of eosinophilic esophagitis with greater than 12 per high-power field, clinically it looks like it is eosinophilic esophagitis.  Last dilation was to 20 mm and August 2019. ? ?Wt Readings from Last 3 Encounters:  ?12/07/21 167 lb 9.6 oz (76 kg)  ?04/30/18 166 lb (75.3 kg)  ?04/26/18 166 lb 3.2 oz (75.4 kg)  ? ? ?No Known Allergies ?Current Meds  ?Medication Sig  ? Empagliflozin (JARDIANCE PO) Take 12.5 mg by mouth daily.  ? Insulin Detemir (LEVEMIR [place]) Inject 22-28 Units into the skin at bedtime.   ? levothyroxine (SYNTHROID, LEVOTHROID) 137 MCG tablet Take 137 mcg by mouth daily before breakfast.  ? metFORMIN (GLUCOPHAGE-XR) 750 MG 24 hr tablet Take 750 mg by mouth daily with breakfast. Take 1/2 tablet in the AM and 1 tablet in the PM   ? omeprazole (PRILOSEC) 40 MG capsule TAKE ONE CAPSULE BY MOUTH DAILY  ? rosuvastatin (CRESTOR) 20 MG tablet Take 10 mg by mouth daily.  ? tamsulosin (FLOMAX) 0.4 MG CAPS capsule Take 0.4 mg by mouth. Take  1 capsule 30 minutes after the same meal each day  ? ?Past Medical History:  ?Diagnosis Date  ? BPH (benign prostatic hyperplasia)   ? Diabetes ([place])   ?  type 2  ? Eosinophilic esophagitis 2/58/5277  ? Esophageal stricture   ? GERD (gastroesophageal reflux disease)   ? Hypercholesterolemia   ? Thyroid cancer ([place])   ? ?Past Surgical History:  ?Procedure Laterality Date  ? COLONOSCOPY    ? ESOPHAGOGASTRODUODENOSCOPY (EGD) WITH ESOPHAGEAL DILATION    ? KNEE ARTHROSCOPY Right   ? lipoma removal    ? RETINAL DETACHMENT SURGERY  05/2021  ? SHOULDER SURGERY    ? THYROIDECTOMY, PARTIAL    ? ?Social History  ? ?Social History Narrative  ? 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 Exercise: golf, scuba  ? 2 sons  ? 2-3 caffeine /day  ? No tobacoo, drugs  ? ?family history includes Other in his father and mother. ? ? ?Review of Systems ?As above ? ?Objective:  ? Physical Exam ?BP 122/78   Pulse 64   Ht 6' (1.829 m)   Wt 167 lb 9.6 oz (76 kg)   SpO2 96%   BMI 22.73 kg/m?  ? ? ?

## 2021-12-12 DIAGNOSIS — M7918 Myalgia, other site: Secondary | ICD-10-CM | POA: Diagnosis not present

## 2021-12-12 DIAGNOSIS — M47816 Spondylosis without myelopathy or radiculopathy, lumbar region: Secondary | ICD-10-CM | POA: Diagnosis not present

## 2021-12-26 DIAGNOSIS — E119 Type 2 diabetes mellitus without complications: Secondary | ICD-10-CM | POA: Diagnosis not present

## 2021-12-26 DIAGNOSIS — E039 Hypothyroidism, unspecified: Secondary | ICD-10-CM | POA: Diagnosis not present

## 2021-12-26 DIAGNOSIS — Z85850 Personal history of malignant neoplasm of thyroid: Secondary | ICD-10-CM | POA: Diagnosis not present

## 2021-12-27 ENCOUNTER — Other Ambulatory Visit: Payer: Self-pay | Admitting: Internal Medicine

## 2021-12-27 DIAGNOSIS — R918 Other nonspecific abnormal finding of lung field: Secondary | ICD-10-CM

## 2022-01-24 ENCOUNTER — Ambulatory Visit
Admission: RE | Admit: 2022-01-24 | Discharge: 2022-01-24 | Disposition: A | Discharge status: Home / Self Care | Payer: Medicare Other | Source: Ambulatory Visit | Attending: Internal Medicine | Admitting: Internal Medicine

## 2022-01-24 DIAGNOSIS — R918 Other nonspecific abnormal finding of lung field: Secondary | ICD-10-CM

## 2022-01-24 DIAGNOSIS — R911 Solitary pulmonary nodule: Secondary | ICD-10-CM | POA: Diagnosis not present

## 2022-03-27 DIAGNOSIS — Z125 Encounter for screening for malignant neoplasm of prostate: Secondary | ICD-10-CM | POA: Diagnosis not present

## 2022-03-27 DIAGNOSIS — E119 Type 2 diabetes mellitus without complications: Secondary | ICD-10-CM | POA: Diagnosis not present

## 2022-03-30 DIAGNOSIS — K200 Eosinophilic esophagitis: Secondary | ICD-10-CM | POA: Diagnosis not present

## 2022-03-30 DIAGNOSIS — Z85850 Personal history of malignant neoplasm of thyroid: Secondary | ICD-10-CM | POA: Diagnosis not present

## 2022-03-30 DIAGNOSIS — I2510 Atherosclerotic heart disease of native coronary artery without angina pectoris: Secondary | ICD-10-CM | POA: Diagnosis not present

## 2022-03-30 DIAGNOSIS — Z0000 Encounter for general adult medical examination without abnormal findings: Secondary | ICD-10-CM | POA: Diagnosis not present

## 2022-03-30 DIAGNOSIS — E032 Hypothyroidism due to medicaments and other exogenous substances: Secondary | ICD-10-CM | POA: Diagnosis not present

## 2022-03-30 DIAGNOSIS — R918 Other nonspecific abnormal finding of lung field: Secondary | ICD-10-CM | POA: Diagnosis not present

## 2022-03-30 DIAGNOSIS — E118 Type 2 diabetes mellitus with unspecified complications: Secondary | ICD-10-CM | POA: Diagnosis not present

## 2022-03-30 DIAGNOSIS — N401 Enlarged prostate with lower urinary tract symptoms: Secondary | ICD-10-CM | POA: Diagnosis not present

## 2022-05-29 DIAGNOSIS — R350 Frequency of micturition: Secondary | ICD-10-CM | POA: Diagnosis not present

## 2022-05-29 DIAGNOSIS — R3912 Poor urinary stream: Secondary | ICD-10-CM | POA: Diagnosis not present

## 2022-05-29 DIAGNOSIS — R3915 Urgency of urination: Secondary | ICD-10-CM | POA: Diagnosis not present

## 2022-06-15 DIAGNOSIS — R3912 Poor urinary stream: Secondary | ICD-10-CM | POA: Diagnosis not present

## 2022-06-15 DIAGNOSIS — R350 Frequency of micturition: Secondary | ICD-10-CM | POA: Diagnosis not present

## 2022-06-30 DIAGNOSIS — R918 Other nonspecific abnormal finding of lung field: Secondary | ICD-10-CM | POA: Diagnosis not present

## 2022-06-30 DIAGNOSIS — I8392 Asymptomatic varicose veins of left lower extremity: Secondary | ICD-10-CM | POA: Diagnosis not present

## 2022-06-30 DIAGNOSIS — E118 Type 2 diabetes mellitus with unspecified complications: Secondary | ICD-10-CM | POA: Diagnosis not present

## 2022-06-30 DIAGNOSIS — Z85850 Personal history of malignant neoplasm of thyroid: Secondary | ICD-10-CM | POA: Diagnosis not present

## 2022-06-30 DIAGNOSIS — Z0000 Encounter for general adult medical examination without abnormal findings: Secondary | ICD-10-CM | POA: Diagnosis not present

## 2022-06-30 DIAGNOSIS — E785 Hyperlipidemia, unspecified: Secondary | ICD-10-CM | POA: Diagnosis not present

## 2022-06-30 DIAGNOSIS — I2510 Atherosclerotic heart disease of native coronary artery without angina pectoris: Secondary | ICD-10-CM | POA: Diagnosis not present

## 2022-06-30 DIAGNOSIS — Z9889 Other specified postprocedural states: Secondary | ICD-10-CM | POA: Diagnosis not present

## 2022-06-30 DIAGNOSIS — E032 Hypothyroidism due to medicaments and other exogenous substances: Secondary | ICD-10-CM | POA: Diagnosis not present

## 2022-06-30 DIAGNOSIS — K219 Gastro-esophageal reflux disease without esophagitis: Secondary | ICD-10-CM | POA: Diagnosis not present

## 2022-07-11 DIAGNOSIS — H524 Presbyopia: Secondary | ICD-10-CM | POA: Diagnosis not present

## 2022-07-11 DIAGNOSIS — E119 Type 2 diabetes mellitus without complications: Secondary | ICD-10-CM | POA: Diagnosis not present

## 2022-07-17 DIAGNOSIS — M47816 Spondylosis without myelopathy or radiculopathy, lumbar region: Secondary | ICD-10-CM | POA: Diagnosis not present

## 2022-07-25 DIAGNOSIS — M47816 Spondylosis without myelopathy or radiculopathy, lumbar region: Secondary | ICD-10-CM | POA: Diagnosis not present

## 2022-11-02 DIAGNOSIS — E118 Type 2 diabetes mellitus with unspecified complications: Secondary | ICD-10-CM | POA: Diagnosis not present

## 2022-11-02 DIAGNOSIS — E032 Hypothyroidism due to medicaments and other exogenous substances: Secondary | ICD-10-CM | POA: Diagnosis not present

## 2022-11-02 DIAGNOSIS — E785 Hyperlipidemia, unspecified: Secondary | ICD-10-CM | POA: Diagnosis not present

## 2022-11-02 DIAGNOSIS — Z85850 Personal history of malignant neoplasm of thyroid: Secondary | ICD-10-CM | POA: Diagnosis not present

## 2022-11-24 ENCOUNTER — Other Ambulatory Visit: Payer: Self-pay | Admitting: Internal Medicine

## 2022-12-05 ENCOUNTER — Other Ambulatory Visit (HOSPITAL_BASED_OUTPATIENT_CLINIC_OR_DEPARTMENT_OTHER): Payer: Self-pay | Admitting: Internal Medicine

## 2022-12-05 DIAGNOSIS — R918 Other nonspecific abnormal finding of lung field: Secondary | ICD-10-CM

## 2022-12-20 DIAGNOSIS — M47816 Spondylosis without myelopathy or radiculopathy, lumbar region: Secondary | ICD-10-CM | POA: Diagnosis not present

## 2023-01-01 DIAGNOSIS — M7918 Myalgia, other site: Secondary | ICD-10-CM | POA: Diagnosis not present

## 2023-01-01 DIAGNOSIS — M47816 Spondylosis without myelopathy or radiculopathy, lumbar region: Secondary | ICD-10-CM | POA: Diagnosis not present

## 2023-01-22 ENCOUNTER — Other Ambulatory Visit (HOSPITAL_BASED_OUTPATIENT_CLINIC_OR_DEPARTMENT_OTHER): Payer: Medicare Other

## 2023-01-29 ENCOUNTER — Encounter (HOSPITAL_BASED_OUTPATIENT_CLINIC_OR_DEPARTMENT_OTHER): Payer: Self-pay

## 2023-01-29 ENCOUNTER — Ambulatory Visit (HOSPITAL_BASED_OUTPATIENT_CLINIC_OR_DEPARTMENT_OTHER)
Admission: RE | Admit: 2023-01-29 | Discharge: 2023-01-29 | Disposition: A | Discharge status: Home / Self Care | Payer: Medicare Other | Source: Ambulatory Visit | Attending: Internal Medicine | Admitting: Internal Medicine

## 2023-01-29 DIAGNOSIS — R918 Other nonspecific abnormal finding of lung field: Secondary | ICD-10-CM | POA: Diagnosis not present

## 2023-01-29 DIAGNOSIS — J9811 Atelectasis: Secondary | ICD-10-CM | POA: Diagnosis not present

## 2023-02-06 DIAGNOSIS — M47816 Spondylosis without myelopathy or radiculopathy, lumbar region: Secondary | ICD-10-CM | POA: Diagnosis not present

## 2023-04-02 DIAGNOSIS — E7800 Pure hypercholesterolemia, unspecified: Secondary | ICD-10-CM | POA: Diagnosis not present

## 2023-04-02 DIAGNOSIS — Z125 Encounter for screening for malignant neoplasm of prostate: Secondary | ICD-10-CM | POA: Diagnosis not present

## 2023-04-02 DIAGNOSIS — E039 Hypothyroidism, unspecified: Secondary | ICD-10-CM | POA: Diagnosis not present

## 2023-04-02 DIAGNOSIS — E118 Type 2 diabetes mellitus with unspecified complications: Secondary | ICD-10-CM | POA: Diagnosis not present

## 2023-04-05 DIAGNOSIS — N138 Other obstructive and reflux uropathy: Secondary | ICD-10-CM | POA: Diagnosis not present

## 2023-04-05 DIAGNOSIS — L578 Other skin changes due to chronic exposure to nonionizing radiation: Secondary | ICD-10-CM | POA: Diagnosis not present

## 2023-04-05 DIAGNOSIS — E118 Type 2 diabetes mellitus with unspecified complications: Secondary | ICD-10-CM | POA: Diagnosis not present

## 2023-04-05 DIAGNOSIS — Z85850 Personal history of malignant neoplasm of thyroid: Secondary | ICD-10-CM | POA: Diagnosis not present

## 2023-04-05 DIAGNOSIS — N401 Enlarged prostate with lower urinary tract symptoms: Secondary | ICD-10-CM | POA: Diagnosis not present

## 2023-04-05 DIAGNOSIS — I2510 Atherosclerotic heart disease of native coronary artery without angina pectoris: Secondary | ICD-10-CM | POA: Diagnosis not present

## 2023-04-05 DIAGNOSIS — Z9889 Other specified postprocedural states: Secondary | ICD-10-CM | POA: Diagnosis not present

## 2023-04-05 DIAGNOSIS — E032 Hypothyroidism due to medicaments and other exogenous substances: Secondary | ICD-10-CM | POA: Diagnosis not present

## 2023-04-05 DIAGNOSIS — Z0000 Encounter for general adult medical examination without abnormal findings: Secondary | ICD-10-CM | POA: Diagnosis not present

## 2023-04-05 DIAGNOSIS — R918 Other nonspecific abnormal finding of lung field: Secondary | ICD-10-CM | POA: Diagnosis not present

## 2023-04-26 DIAGNOSIS — E118 Type 2 diabetes mellitus with unspecified complications: Secondary | ICD-10-CM | POA: Diagnosis not present

## 2023-04-26 DIAGNOSIS — E032 Hypothyroidism due to medicaments and other exogenous substances: Secondary | ICD-10-CM | POA: Diagnosis not present

## 2023-04-26 DIAGNOSIS — Z85850 Personal history of malignant neoplasm of thyroid: Secondary | ICD-10-CM | POA: Diagnosis not present

## 2023-04-26 DIAGNOSIS — E785 Hyperlipidemia, unspecified: Secondary | ICD-10-CM | POA: Diagnosis not present

## 2023-04-26 DIAGNOSIS — E162 Hypoglycemia, unspecified: Secondary | ICD-10-CM | POA: Diagnosis not present

## 2023-06-28 IMAGING — CT CT CARDIAC CORONARY ARTERY CALCIUM SCORE
3 series · 12 of 20 positions shown, 14 images · non-contrast
Comparison: None.

CLINICAL DATA: 75-year-old Caucasian male hyperlipidemia

EXAM:
CT CARDIAC CORONARY ARTERY CALCIUM SCORE
TECHNIQUE: Non-contrast imaging through the heart was performed using
prospective ECG gating. Image post processing was performed on an
independent workstation, allowing for quantitative analysis of the
heart and coronary arteries. Note that this exam targets the heart
and the chest was not imaged in its entirety.

[Series 2: calcium scoring 2.00 qr36 bestdiast 70% hrt calciu · axial · 0.44mm/px · z∈[+1768,+1796]mm · 2 of 70 slices shown]
[im 14/70  vessel]
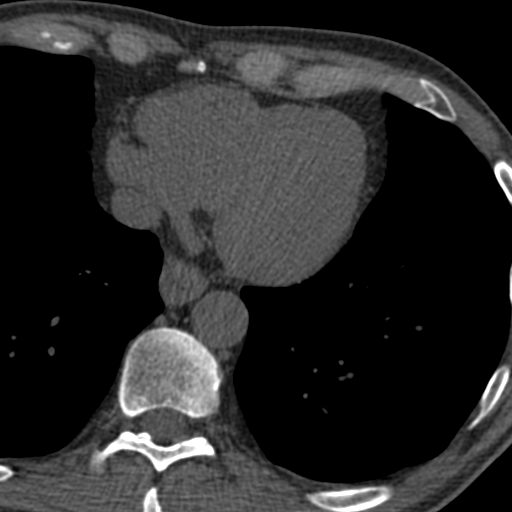
[im 28/70  vessel]
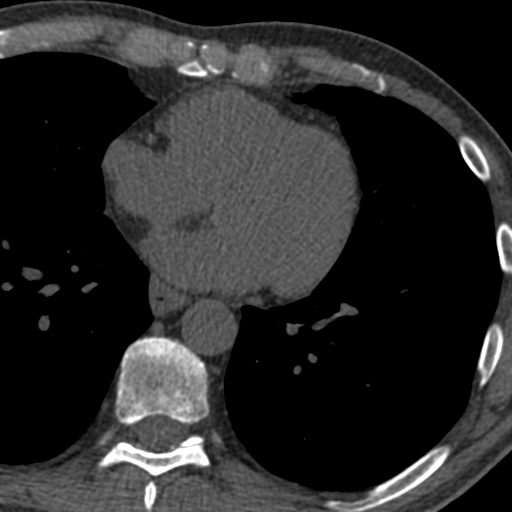

[Series 3: calcium scoring 2.00 br40 bestdiast 70% axial · axial · 0.49mm/px · z∈[+1762,+1856]mm · 5 of 71 slices shown, 7 images]
[im 12/71  vessel]
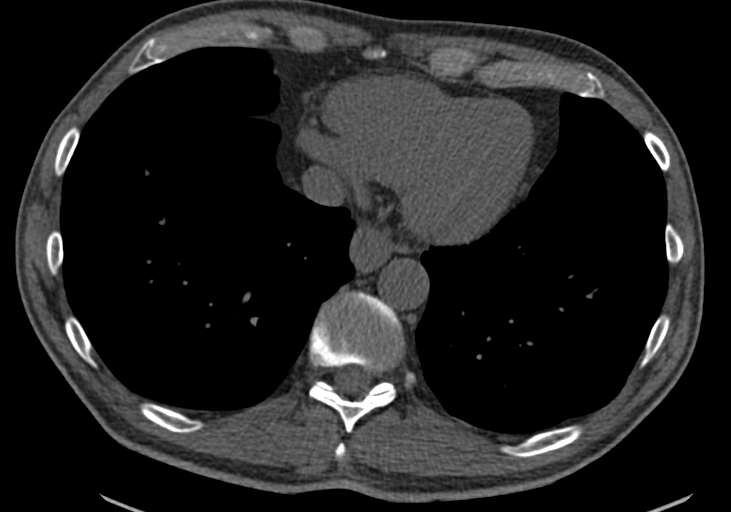
[im 12/71  lung]
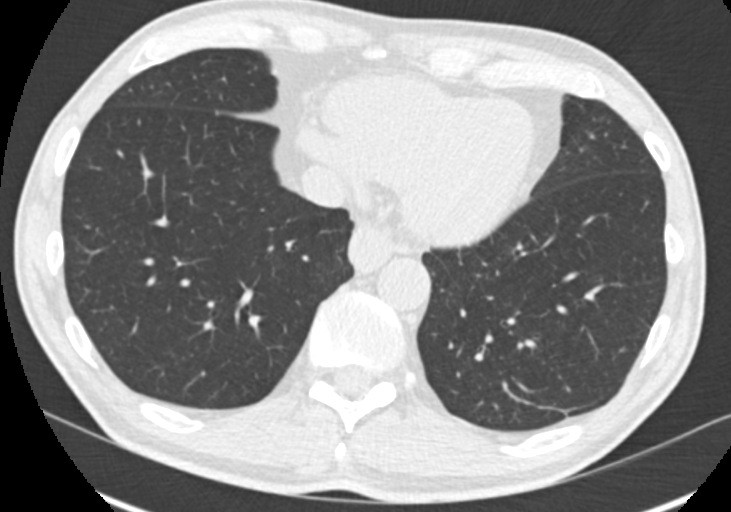
[im 24/71  vessel]
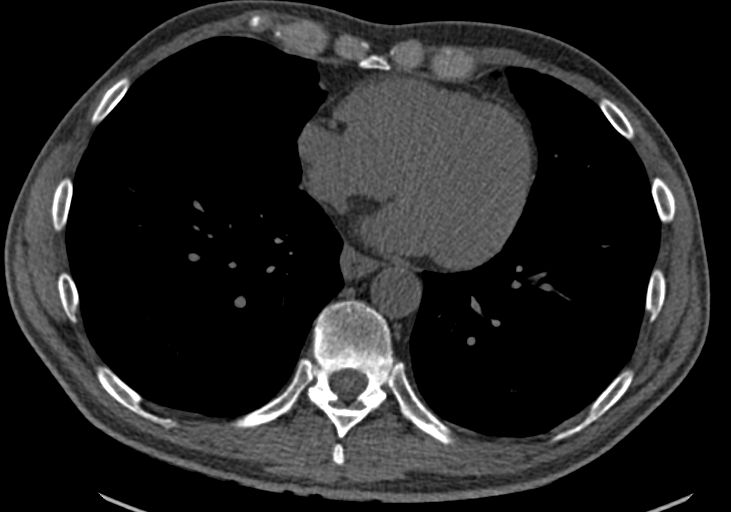
[im 36/71  vessel]
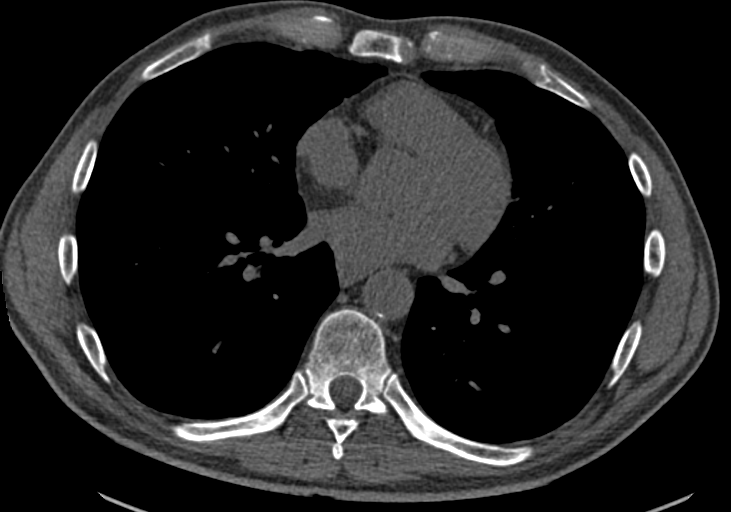
[im 47/71  vessel]
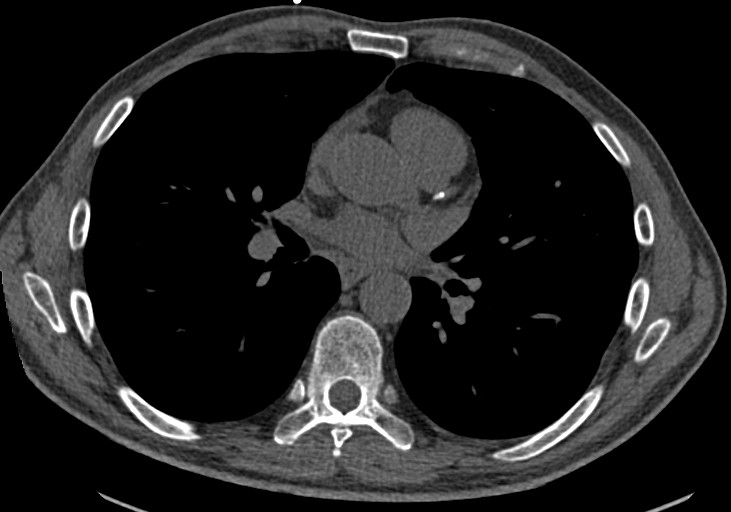
[im 59/71  vessel]
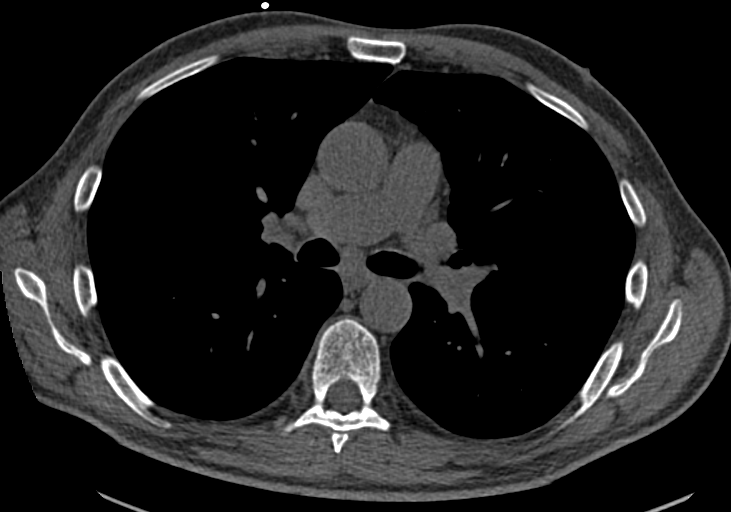
[im 59/71  lung]
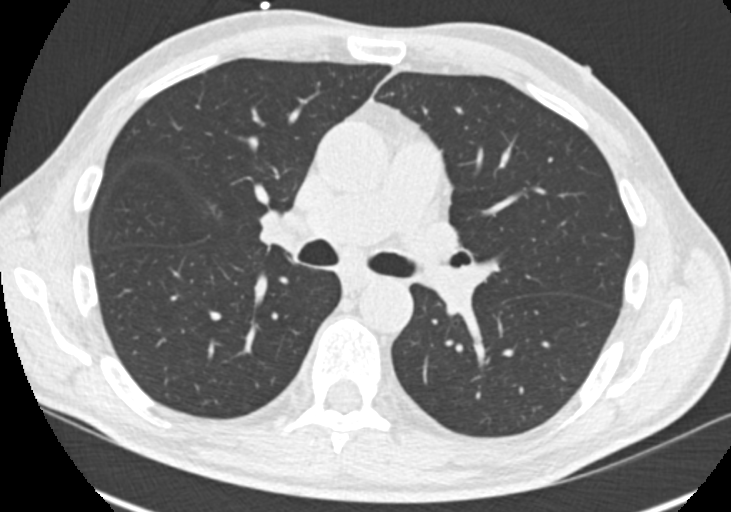

[Series 9: calcium scoring 2.00 br60 bestdiast 70% lungs · axial · 0.48mm/px · z∈[+1764,+1856]mm · 5 of 70 slices shown]
[im 12/70  vessel]
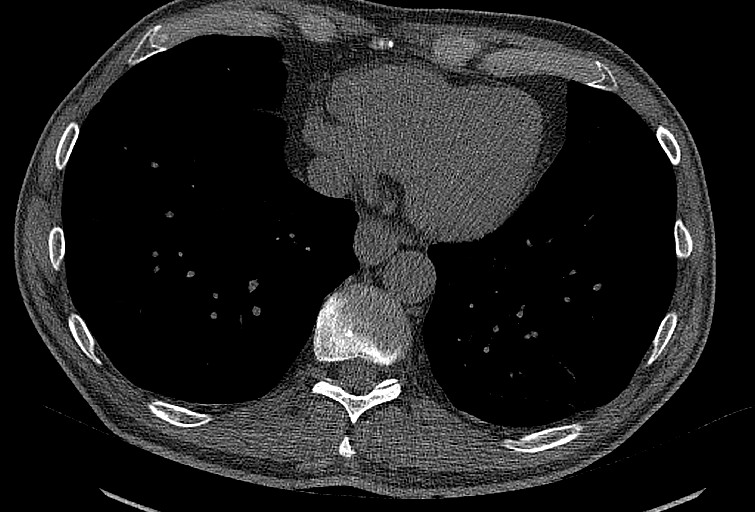
[im 24/70  vessel]
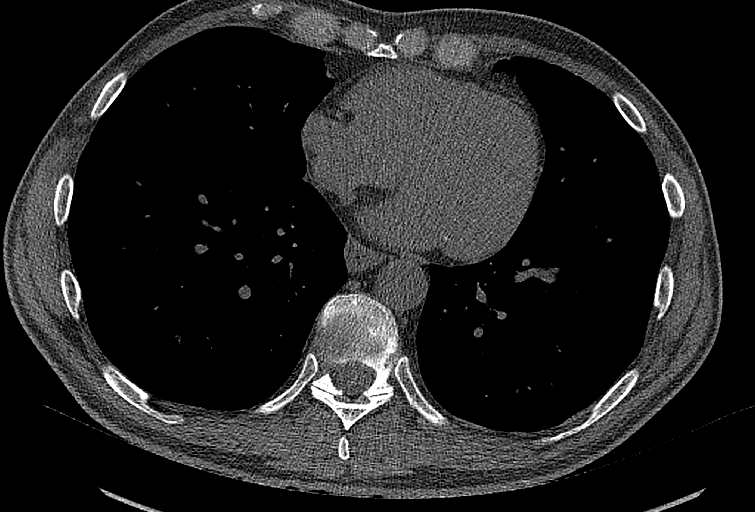
[im 35/70  vessel]
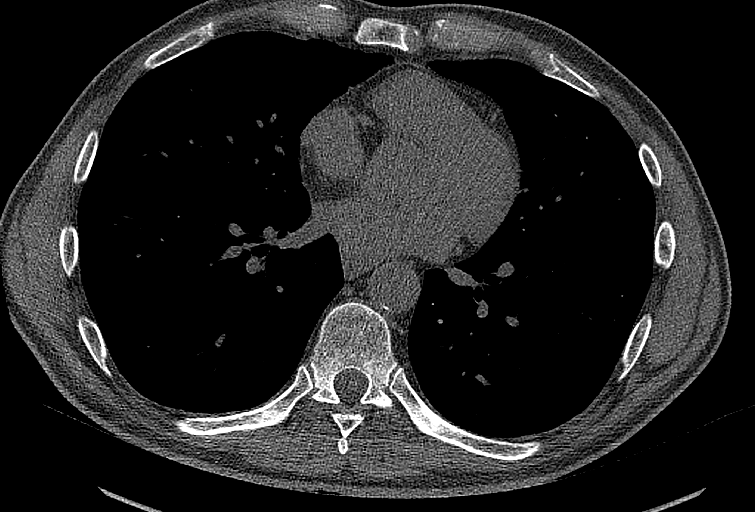
[im 47/70  vessel]
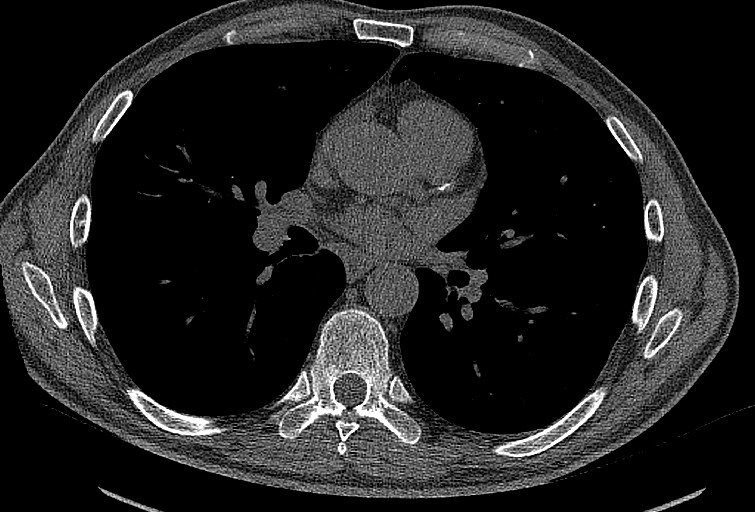
[im 58/70  vessel]
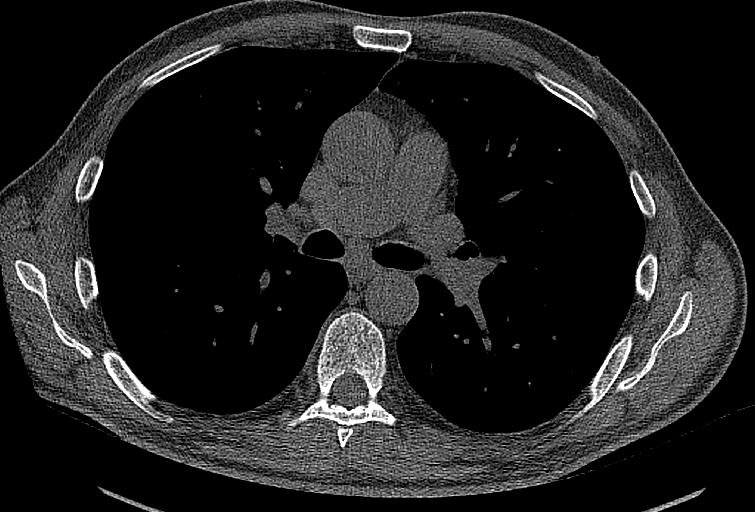

[12 of 20 positions shown; findings below may reference images not displayed]

FINDINGS: Technical quality: Good

CORONARY CALCIUM SCORES:

Left Main: No coronary artery calcification

LAD: 65

LCx: No coronary calcification

RCA: No coronary calcification

CORONARY CALCIUM

Total Agatston Score: 65

[HOSPITAL] percentile: 27

Ascending aorta (normal <  40 mm): 33 mm

EXTRACARDIAC FINDINGS:

Limited view of the lung parenchyma demonstrates 4 mm angular nodule
in RIGHT lower lobe (image 64/3) has benign morphology nodular
thickening along the fissure of the RIGHT lower lobe measuring up to
5 mm (image [DATE]) rounded nodule in the LEFT lower lobe measures 6
mm (image 45/3). Airways are normal.

Limited view of the mediastinum demonstrates no adenopathy.
Esophagus normal.

Limited view of the upper abdomen is unremarkable.

Limited view of the skeleton and chest wall is unremarkable.
IMPRESSION: 1. CORONARY CALCIUM

LAD coronary artery calcification.

Total Agatston Score: 65

[HOSPITAL] percentile: 27

2. LEFT and RIGHT lower lobe nodules. Largest nodule in the LEFT
lower lobe measures 6 mm. Non-contrast chest CT at 6-12 months is
recommended. If the nodule is stable at time of repeat CT, then
future CT at 18-24 months (from today's scan) is considered optional
for low-risk patients, but is recommended for high-risk patients.
This recommendation follows the consensus statement: Guidelines for
Management of Incidental Pulmonary Nodules Detected on CT Images:

## 2023-08-24 DIAGNOSIS — E119 Type 2 diabetes mellitus without complications: Secondary | ICD-10-CM | POA: Diagnosis not present

## 2023-10-30 DIAGNOSIS — Z85850 Personal history of malignant neoplasm of thyroid: Secondary | ICD-10-CM | POA: Diagnosis not present

## 2023-10-30 DIAGNOSIS — K219 Gastro-esophageal reflux disease without esophagitis: Secondary | ICD-10-CM | POA: Diagnosis not present

## 2023-10-30 DIAGNOSIS — Z9889 Other specified postprocedural states: Secondary | ICD-10-CM | POA: Diagnosis not present

## 2023-10-30 DIAGNOSIS — E785 Hyperlipidemia, unspecified: Secondary | ICD-10-CM | POA: Diagnosis not present

## 2023-10-30 DIAGNOSIS — E039 Hypothyroidism, unspecified: Secondary | ICD-10-CM | POA: Diagnosis not present

## 2023-10-30 DIAGNOSIS — E118 Type 2 diabetes mellitus with unspecified complications: Secondary | ICD-10-CM | POA: Diagnosis not present

## 2023-10-30 DIAGNOSIS — E032 Hypothyroidism due to medicaments and other exogenous substances: Secondary | ICD-10-CM | POA: Diagnosis not present

## 2023-10-30 DIAGNOSIS — E7800 Pure hypercholesterolemia, unspecified: Secondary | ICD-10-CM | POA: Diagnosis not present

## 2023-11-24 ENCOUNTER — Other Ambulatory Visit: Payer: Self-pay | Admitting: Internal Medicine

## 2024-01-07 DIAGNOSIS — E032 Hypothyroidism due to medicaments and other exogenous substances: Secondary | ICD-10-CM | POA: Diagnosis not present

## 2024-01-14 DIAGNOSIS — M47816 Spondylosis without myelopathy or radiculopathy, lumbar region: Secondary | ICD-10-CM | POA: Diagnosis not present

## 2024-02-20 ENCOUNTER — Other Ambulatory Visit: Payer: Self-pay | Admitting: Internal Medicine

## 2024-03-26 IMAGING — CT CT CHEST W/O CM
1 of 2 series · 15 of 32 positions shown, 19 images · non-contrast
Comparison: CT for coronary artery calcium scoring, 04/27/2021.

CLINICAL DATA: Follow-up pulmonary nodules.



[Series 6: super d · axial · 0.75mm/px · z∈[-120,+186]mm · 15 of 428 slices shown, 19 images]
[im 23/428  mediastinal]
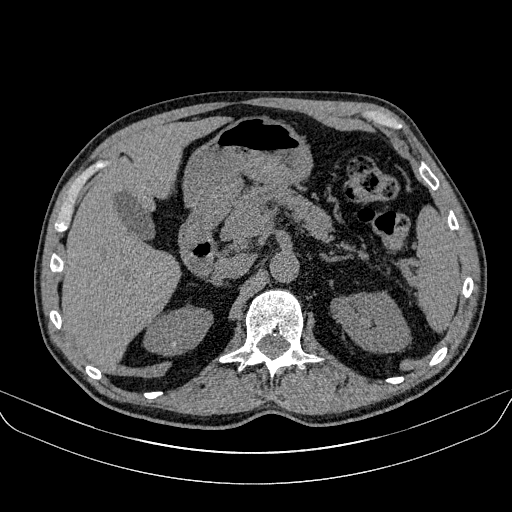
[im 23/428  lung]
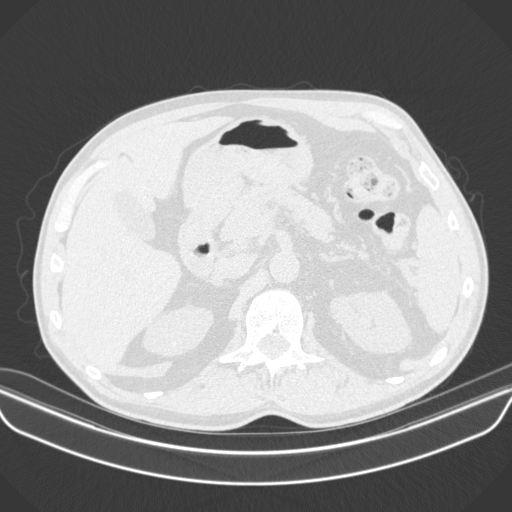
[im 68/428  lung]
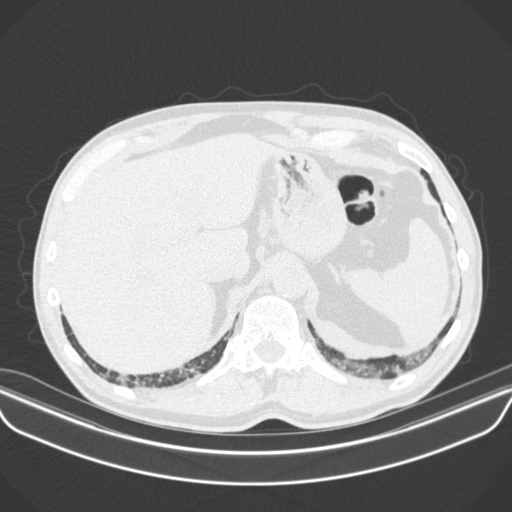
[im 90/428  lung]
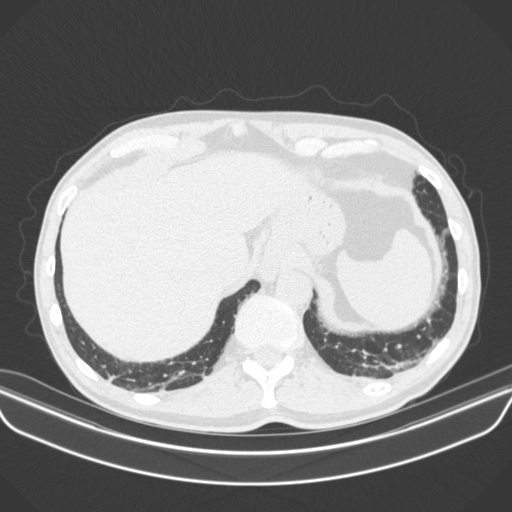
[im 113/428  lung]
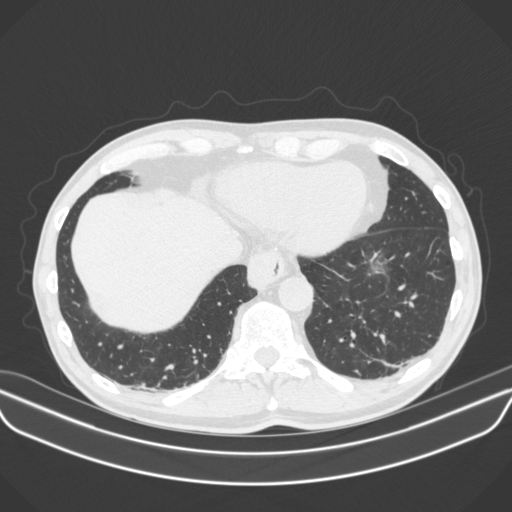
[im 143/428  mediastinal]
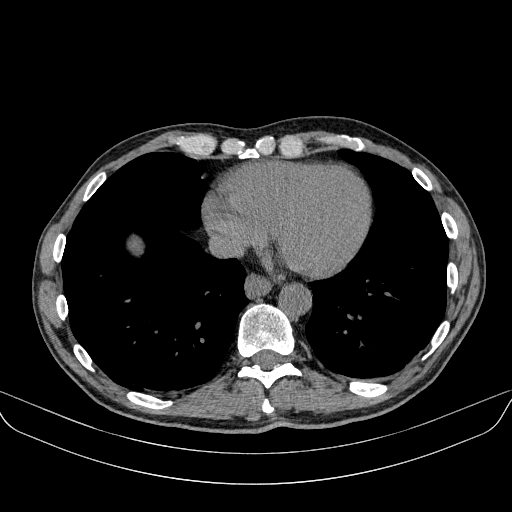
[im 143/428  lung]
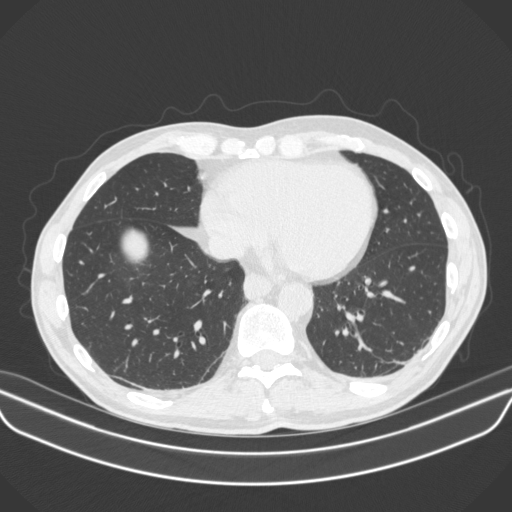
[im 158/428  lung]
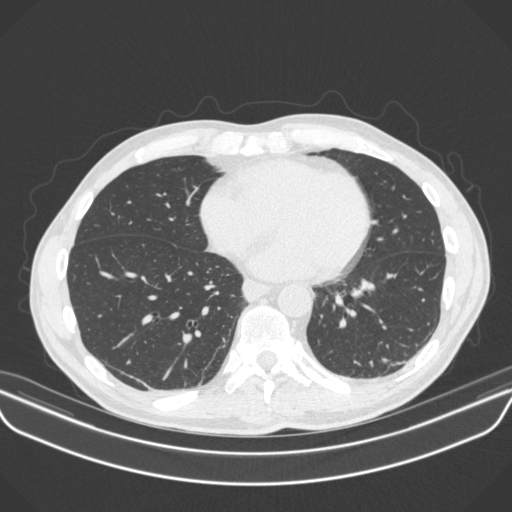
[im 202/428  lung]
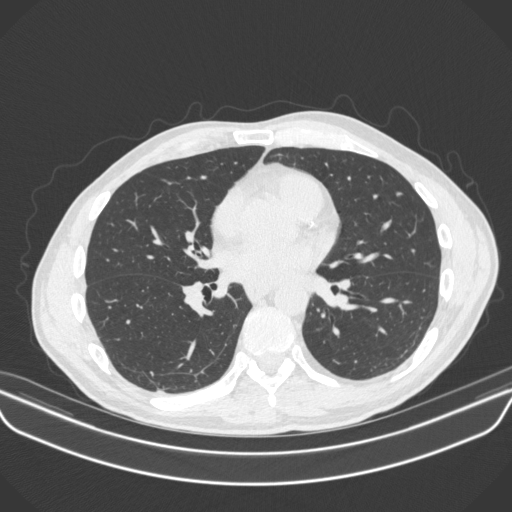
[im 203/428  lung]
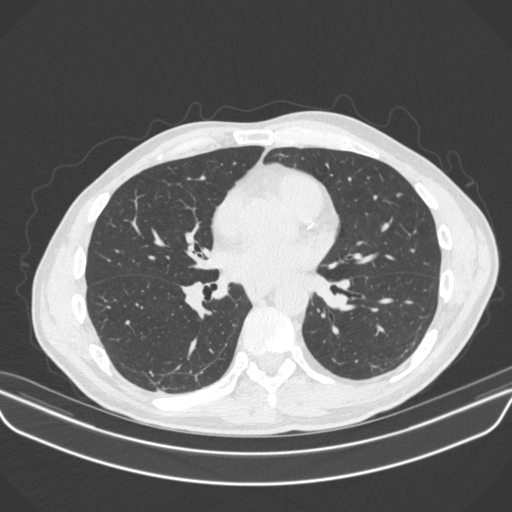
[im 225/428  mediastinal]
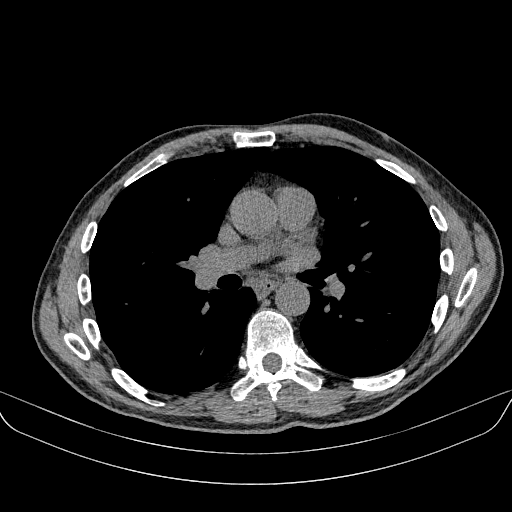
[im 225/428  lung]
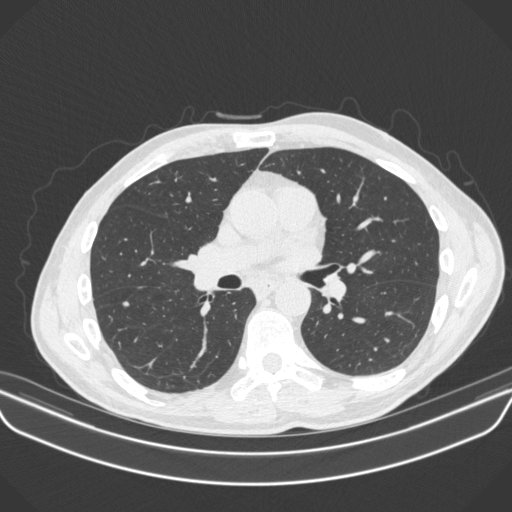
[im 270/428  lung]
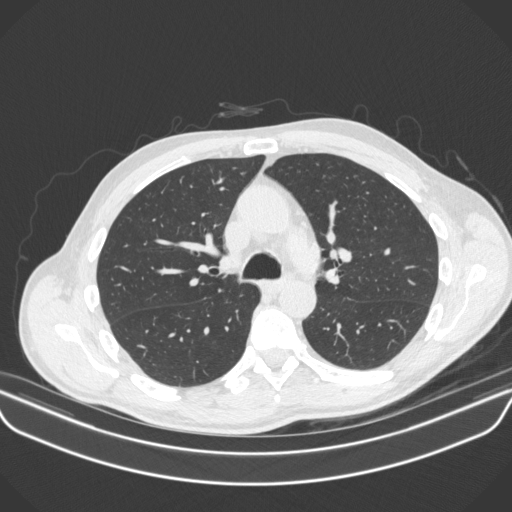
[im 285/428  lung]
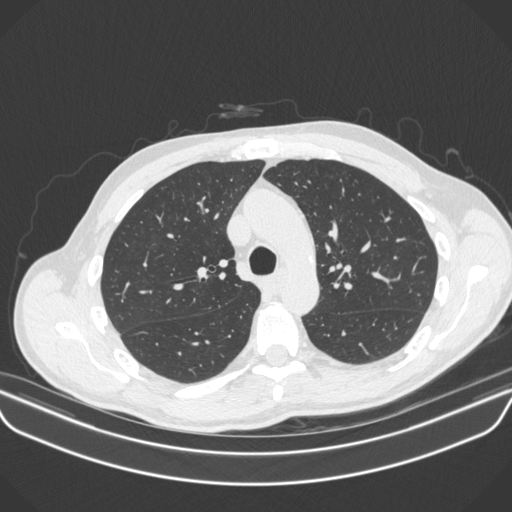
[im 315/428  lung]
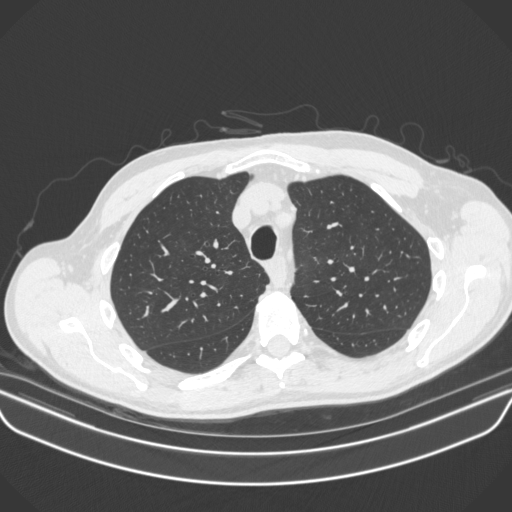
[im 338/428  mediastinal]
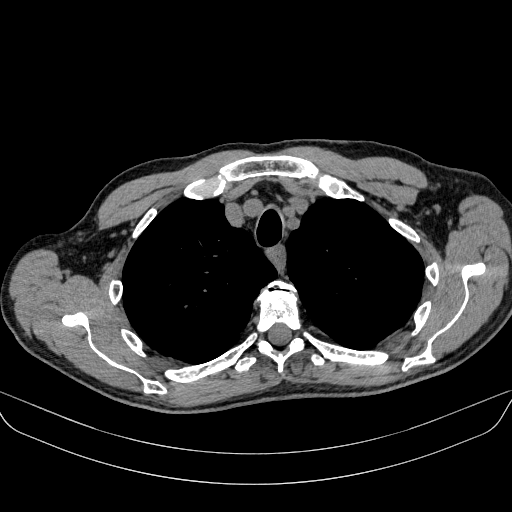
[im 338/428  lung]
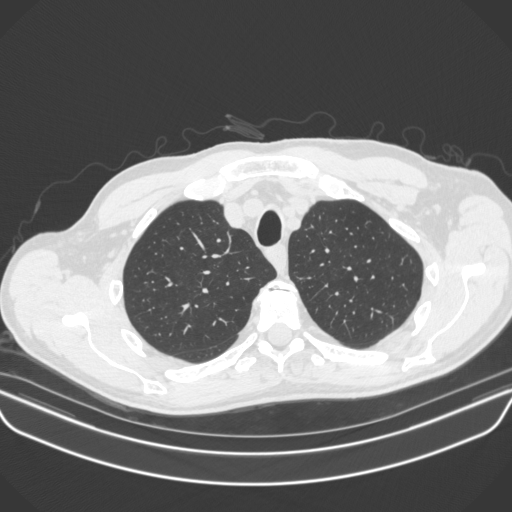
[im 360/428  lung]
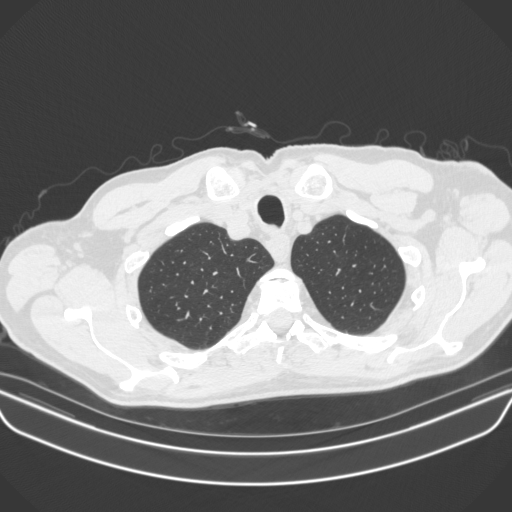
[im 405/428  lung]
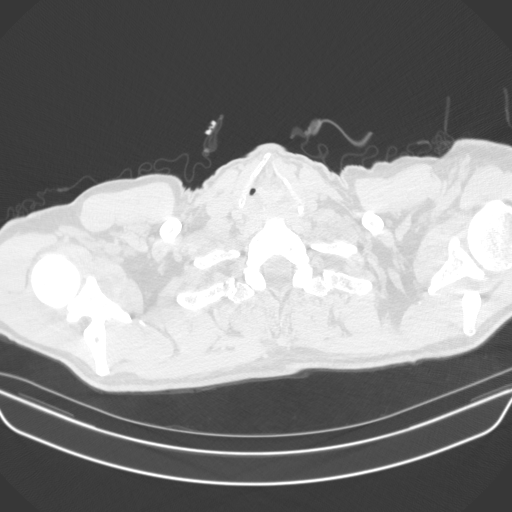

[15 of 32 positions shown; findings below may reference images not displayed]

FINDINGS: Cardiovascular: Heart is normal in size. No pericardial effusion.
Stable left coronary artery atherosclerotic calcifications. Great
vessels are normal in caliber. Minimal aortic atherosclerotic
calcifications, also stable.

Mediastinum/Nodes: No neck base, mediastinal or hilar masses or
enlarged lymph nodes. Normal trachea. Normal soft [place]. Small sliding
hiatal hernia.

Lungs/Pleura: Stable lung nodules. Specifically, 3-4 mm nodule,
right upper lobe, image 70, series 5. Two adjacent nodules along an
accessory fissure, right lower lobe, images 84 and 83 series 5,
largest nodule mean 5 mm. Triangular-shaped 4 mm nodule, right lower
lobe, image 120 series 5. 6 mm nodule, left lower lobe, image 113,
series 5.

No new nodules.

Mild linear dependent lower lobe opacities consistent with
atelectasis. Remainder of the lungs is clear. No pleural effusion or
pneumothorax.

Upper Abdomen: No acute or significant abnormalities.

Musculoskeletal: No fracture or acute finding. No bone lesion.
Bilateral glenohumeral joint arthropathic changes.
IMPRESSION: 1. Small bilateral stable lung nodules as detailed, stable since
04/27/2021. Based on [HOSPITAL] guidelines from 4464,
consider 1 additional follow-up exam, in 1 year to establish longer
term stability. See recommendations from the report from the CT
calcium scoring exam on 04/27/2021.
[DATE]. No acute findings.
3. Stable left coronary artery calcifications stable minimal aortic
atherosclerosis.

Aortic Atherosclerosis ([id number]-Z92.2).

## 2024-04-08 DIAGNOSIS — E119 Type 2 diabetes mellitus without complications: Secondary | ICD-10-CM | POA: Diagnosis not present

## 2024-04-08 DIAGNOSIS — Z125 Encounter for screening for malignant neoplasm of prostate: Secondary | ICD-10-CM | POA: Diagnosis not present

## 2024-04-08 DIAGNOSIS — E7800 Pure hypercholesterolemia, unspecified: Secondary | ICD-10-CM | POA: Diagnosis not present

## 2024-04-10 DIAGNOSIS — N401 Enlarged prostate with lower urinary tract symptoms: Secondary | ICD-10-CM | POA: Diagnosis not present

## 2024-04-10 DIAGNOSIS — R972 Elevated prostate specific antigen [PSA]: Secondary | ICD-10-CM | POA: Diagnosis not present

## 2024-04-10 DIAGNOSIS — K200 Eosinophilic esophagitis: Secondary | ICD-10-CM | POA: Diagnosis not present

## 2024-04-10 DIAGNOSIS — I2510 Atherosclerotic heart disease of native coronary artery without angina pectoris: Secondary | ICD-10-CM | POA: Diagnosis not present

## 2024-04-10 DIAGNOSIS — K219 Gastro-esophageal reflux disease without esophagitis: Secondary | ICD-10-CM | POA: Diagnosis not present

## 2024-04-10 DIAGNOSIS — Z794 Long term (current) use of insulin: Secondary | ICD-10-CM | POA: Diagnosis not present

## 2024-04-10 DIAGNOSIS — R918 Other nonspecific abnormal finding of lung field: Secondary | ICD-10-CM | POA: Diagnosis not present

## 2024-04-10 DIAGNOSIS — I700 Atherosclerosis of aorta: Secondary | ICD-10-CM | POA: Diagnosis not present

## 2024-04-10 DIAGNOSIS — Z0000 Encounter for general adult medical examination without abnormal findings: Secondary | ICD-10-CM | POA: Diagnosis not present

## 2024-04-10 DIAGNOSIS — E032 Hypothyroidism due to medicaments and other exogenous substances: Secondary | ICD-10-CM | POA: Diagnosis not present

## 2024-04-10 DIAGNOSIS — E118 Type 2 diabetes mellitus with unspecified complications: Secondary | ICD-10-CM | POA: Diagnosis not present

## 2024-05-01 DIAGNOSIS — R972 Elevated prostate specific antigen [PSA]: Secondary | ICD-10-CM | POA: Diagnosis not present

## 2024-05-19 DIAGNOSIS — E7800 Pure hypercholesterolemia, unspecified: Secondary | ICD-10-CM | POA: Diagnosis not present

## 2024-05-19 DIAGNOSIS — Z85850 Personal history of malignant neoplasm of thyroid: Secondary | ICD-10-CM | POA: Diagnosis not present

## 2024-05-19 DIAGNOSIS — E032 Hypothyroidism due to medicaments and other exogenous substances: Secondary | ICD-10-CM | POA: Diagnosis not present

## 2024-05-19 DIAGNOSIS — E118 Type 2 diabetes mellitus with unspecified complications: Secondary | ICD-10-CM | POA: Diagnosis not present

## 2024-05-19 DIAGNOSIS — I2510 Atherosclerotic heart disease of native coronary artery without angina pectoris: Secondary | ICD-10-CM | POA: Diagnosis not present

## 2024-07-24 ENCOUNTER — Ambulatory Visit (INDEPENDENT_AMBULATORY_CARE_PROVIDER_SITE_OTHER): Admitting: Otolaryngology

## 2024-07-24 ENCOUNTER — Encounter (INDEPENDENT_AMBULATORY_CARE_PROVIDER_SITE_OTHER): Payer: Self-pay | Admitting: Otolaryngology

## 2024-07-24 VITALS — BP 134/73 | HR 77 | Ht 72.0 in | Wt 162.0 lb

## 2024-07-24 DIAGNOSIS — H9190 Unspecified hearing loss, unspecified ear: Secondary | ICD-10-CM | POA: Diagnosis not present

## 2024-07-24 DIAGNOSIS — H9313 Tinnitus, bilateral: Secondary | ICD-10-CM | POA: Diagnosis not present

## 2024-07-24 NOTE — Patient Instructions (Addendum)
 Lipoflavonoids -- vitamins and minerals.  Can try masking --- can buy white noise machine on [employer] Tinnitus retraining.

## 2024-07-24 NOTE — Progress Notes (Signed)
 Dear Dr. Tommas, Here is my assessment for our mutual patient, George Ford. Thank you for allowing me the opportunity to care for your patient. Please do not hesitate to contact me should you have any other questions. Sincerely, Dr. Eldora Blanch  Otolaryngology Clinic Note Referring provider: Dr. Tommas HPI:  Initial visit (07/2024): Patient reports: he has had tinnitus for about 2-4 years, never really paid attention. Feels like it is more noticeable and getting worse now. Both ears. One ear not worse than other. Most apparent in quiet environment. Steady sound - no heartbeat. Has not tried anything.  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 No antecedent event for tinnitus. No recent hearing test.   ENT Surgery: no Personal or FHx of bleeding dz or anesthesia difficulty: no   AP/AC: ASA 81  Tobacco: no  PMHx: DM, Hypothyroidism (Thyroid  Ca hx), GERD, HLD  Independent Review of Additional Tests or Records:  DR. Tommas (06/18/2024): Referral notes reviewed and uploaded or available in chart in media tab  - noted bilateral tinnitus, not improving; Dx: Tinnitus; Rx: ref to ENT Labs reviewed and uploaded or available in chart in media tab (04/08/2024): BUN/Cr 19/0.89; WBC 6.1, Eos 200; TSH 01/07/2024: wnl  PMH/Meds/All/SocHx/FamHx/ROS:   Past Medical History:  Diagnosis Date   BPH (benign prostatic hyperplasia)    Diabetes (HCC)    type 2   Eosinophilic esophagitis 04/30/2018   Esophageal stricture    GERD (gastroesophageal reflux disease)    Hypercholesterolemia    Thyroid  cancer (HCC)      Past  Surgical History:  Procedure Laterality Date   COLONOSCOPY     ESOPHAGOGASTRODUODENOSCOPY (EGD) WITH ESOPHAGEAL DILATION     KNEE ARTHROSCOPY Right    lipoma removal     RETINAL DETACHMENT SURGERY  05/2021   SHOULDER SURGERY     THYROIDECTOMY, PARTIAL      Family History  Problem Relation Age of Onset   Other Mother        old age   Other Father        MVA   Colon polyps Neg Hx    Esophageal cancer Neg Hx    Rectal cancer Neg Hx    Stomach cancer Neg Hx      Social Connections: Not on file      Current Outpatient Medications:    Empagliflozin (JARDIANCE PO), Take 12.5 mg by mouth daily., Disp: , Rfl:    Insulin Detemir (LEVEMIR [place]), Inject 22-28 Units into the skin at bedtime. , Disp: , Rfl:    levothyroxine (SYNTHROID, LEVOTHROID) 137 MCG tablet, Take 137 mcg by mouth daily before breakfast., Disp: , Rfl:    metFORMIN (GLUCOPHAGE-XR) 750 MG 24 hr tablet, Take 750 mg by mouth daily with breakfast. Take 1/2 tablet in the AM and 1 tablet in the PM , Disp: , Rfl:    omeprazole  (PRILOSEC) 40 MG capsule, TAKE 1 CAPSULE BY MOUTH DAILY, Disp: 90 capsule, Rfl: 0   rosuvastatin (CRESTOR) 20 MG tablet, Take 10 mg by mouth daily., Disp: , Rfl:    solifenacin (VESICARE) 10 MG tablet, Take 10 mg by mouth daily., Disp: , Rfl:    tamsulosin (FLOMAX) 0.4 MG  CAPS capsule, Take 0.4 mg by mouth. Take  1 capsule 30 minutes after the same meal each day, Disp: , Rfl:    Physical Exam:   BP 134/73 (BP Location: Right Arm, Patient Position: Sitting, Cuff Size: Large)   Pulse 77   Ht 6' (1.829 m)   Wt 162 lb (73.5 kg)   SpO2 94%   BMI 21.97 kg/m   Salient findings:  CN II-XII intact Bilateral EAC clear and TM intact with well pneumatized middle ear spaces Weber 512: mid [place]in[place] 512: AC > BC b/l Anterior rhinoscopy: Septum intact; bilateral inferior turbinates without significant hypertrophy No lesions of oral cavity/oropharynx No obviously palpable neck  masses/lymphadenopathy/thyromegaly No respiratory distress or stridor  Seprately Identifiable Procedures:  Prior to initiating any procedures, risks/benefits/alternatives were explained to the patient and verbal consent obtained. None  Impression & Plans:  George Ford is a 78 y.o. male with:  1. Bilateral tinnitus   2. Subjective hearing loss    Bilateral tinnitus, worsening, most likely due to presbycusis. We discussed etiologies -- including somatic, hearing loss related, medications and otherwise. Will obtain audio first. Discussed Rx including lipoflavonoids, hearing aids, masking, and retraining. Will discuss by phone after audiogram   See below regarding exact medications prescribed this encounter including dosages and route: No orders of the defined types were placed in this encounter.     Thank you for allowing me the opportunity to care for your patient. Please do not hesitate to contact me should you have any other questions.  Sincerely, Eldora Blanch, MD Otolaryngologist (ENT), [facility] Phone: [phone] Fax: [phone]  07/24/2024, 8:34 AM   MDM:  Level 4 - [phone] Complexity/Problems addressed: mod - chronic worsening problem Data complexity: mod - independent review of note, labs; ordering test - Morbidity: low  - Prescription Drug prescribed or managed: n

## 2024-08-20 ENCOUNTER — Ambulatory Visit: Admitting: Gastroenterology

## 2024-09-19 ENCOUNTER — Ambulatory Visit (INDEPENDENT_AMBULATORY_CARE_PROVIDER_SITE_OTHER): Admitting: Audiology

## 2024-09-19 DIAGNOSIS — H903 Sensorineural hearing loss, bilateral: Secondary | ICD-10-CM | POA: Diagnosis not present

## 2024-09-19 NOTE — Progress Notes (Signed)
" °  [address] [place], [place] [address] [phone]  Audiological Evaluation    Name: George Ford, DDS     DOB:   [birth year]      MRN:   [id number]                                                                                     Service Date: 09/19/2024     Accompanied by: self    Patient comes today after Dr. Tobie, ENT sent a referral for a hearing evaluation due to concerns with tinnitus.   Symptoms Yes Details  Hearing loss  []    Tinnitus  [x]  Likely longstanding for years- both ears  Ear pain/ infections/pressure  []  Reports left ear has a harder time clearing up when he goes scuba diving.  Balance problems  []    Noise exposure history  [x]  Occupational as a education officer, community.  Previous ear surgeries  []    Family history of hearing loss  []    Amplification  []    Other  []      Otoscopy: Right ear: Clear external ear canal and notable landmarks visualized on the tympanic membrane. Left ear:  Clear external ear canal and notable landmarks visualized on the tympanic membrane.  Tympanometry: Right ear: Type A - Normal external ear canal volume with normal middle ear pressure and normal tympanic membrane compliance. Findings are consistent with normal middle ear function. Left ear: Type A - Normal external ear canal volume with normal middle ear pressure and normal tympanic membrane compliance. Findings are consistent with normal middle ear function.   Hearing Evaluation The hearing test results were completed under headphones and re-checked with inserts and results are deemed to be of good reliability. Test technique:  conventional    Pure tone Audiometry: Right ear- Normal hearing from [phone] Hz, then mild to moderate  sensorineural hearing loss from 4000 Hz - 8000 Hz. Left ear-  Normal hearing from [phone] Hz, then mild to moderately severe sensorineural hearing loss from 3000 Hz - 8000 Hz.  Speech Audiometry: Right ear- Speech Reception Threshold (SRT)  was obtained at 15 dBHL. Left ear-Speech Reception Threshold (SRT) was obtained at 15 dBHL.   Word Recognition Score Tested using NU-6 (recorded) Right ear: 100% was obtained at a presentation level of 70 dBHL with contralateral masking which is deemed as  excellent. Left ear: 100% was obtained at a presentation level of 70 dBHL with contralateral masking which is deemed as  excellent.   Impression: There is a significant difference in pure-tone thresholds between ears, worse in the left ear from 3000-8000 Hz ( 10-15 dBHL difference).   Recommendations: Follow up with ENT as scheduled. Return for a hearing evaluation in one year, before if concerns with hearing changes arise or per MD recommendation. Consider a communication needs assessment for amplification after medical clearance is obtained and pending patient motivation.   Rakesh Dutko MARIE LEROUX-MARTINEZ, AUD  "

## 2024-09-23 ENCOUNTER — Ambulatory Visit (INDEPENDENT_AMBULATORY_CARE_PROVIDER_SITE_OTHER): Admitting: Otolaryngology

## 2024-09-23 ENCOUNTER — Encounter (INDEPENDENT_AMBULATORY_CARE_PROVIDER_SITE_OTHER): Payer: Self-pay | Admitting: Otolaryngology

## 2024-09-23 DIAGNOSIS — H903 Sensorineural hearing loss, bilateral: Secondary | ICD-10-CM

## 2024-09-23 DIAGNOSIS — H9313 Tinnitus, bilateral: Secondary | ICD-10-CM | POA: Diagnosis not present

## 2024-09-23 NOTE — Progress Notes (Signed)
 Dear Dr. Clarice, Here is my assessment for our mutual patient, George Ford. Thank you for allowing me the opportunity to care for your patient. Please do not hesitate to contact me should you have any other questions. Sincerely, Dr. Eldora Blanch  Otolaryngology Clinic Note Referring provider: Dr. Clarice HPI:  Initial visit (07/2024): Patient reports: he has had tinnitus for about 2-4 years, never really paid attention. Feels like it is more noticeable and getting worse now. Both ears. One ear not worse than other. Most apparent in quiet environment. Steady sound - no heartbeat. Has not tried anything.  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 No antecedent event for tinnitus. No recent hearing test.   --------------------------------------------------------- 09/23/2024 The patient gave consent to have this visit done by telemedicine / virtual visit, two identifiers were used to identify patient. This is also consent for access the chart and treat the patient via this visit. The patient is located in [place] .  I, the provider, am at the office.  We spent 8 minutes together for the visit.  Joined by telephone.  We discussed his HT. Could not try lipoflavonoids due to high Vit C which interferes with his BG. Tinnitus stable, no hearing concerns.Otherwise ears essetnailly asymptomatic  ENT Surgery: no Personal or FHx of bleeding dz or anesthesia difficulty: no   AP/AC: ASA 81  Tobacco: no  PMHx: DM, Hypothyroidism (Thyroid  Ca hx), GERD, HLD  Independent Review of Additional Tests or Records:  DR.  Tommas (06/18/2024): Referral notes reviewed and uploaded or available in chart in media tab  - noted bilateral tinnitus, not improving; Dx: Tinnitus; Rx: ref to ENT Labs reviewed and uploaded or available in chart in media tab (04/08/2024): BUN/Cr 19/0.89; WBC 6.1, Eos 200; TSH 01/07/2024: wnl Audio 09/2024 reviewed and interpreted - normal downsloping to mod/sev SNHL as noted in test; WRT 100% at 70dB HL AU; A/A tymps; asymmetry 10-15 dB 3-8K Hz PMH/Meds/All/SocHx/FamHx/ROS:   Past Medical History:  Diagnosis Date   BPH (benign prostatic hyperplasia)    Diabetes (HCC)    type 2   Eosinophilic esophagitis 04/30/2018   Esophageal stricture    GERD (gastroesophageal reflux disease)    Hypercholesterolemia    Thyroid  cancer [facility])      Past Surgical History:  Procedure Laterality Date   COLONOSCOPY     ESOPHAGOGASTRODUODENOSCOPY (EGD) WITH ESOPHAGEAL DILATION     KNEE ARTHROSCOPY Right    lipoma removal     RETINAL DETACHMENT SURGERY  05/2021   SHOULDER SURGERY     THYROIDECTOMY, PARTIAL      Family History  Problem Relation Age of Onset   Other Mother        old age   Other Father        MVA   Colon polyps Neg Hx    Esophageal cancer Neg Hx    Rectal cancer Neg Hx    Stomach cancer Neg Hx      Social Connections: Not on file      Current Outpatient Medications:    Empagliflozin (JARDIANCE PO), Take 12.5 mg by mouth daily., Disp: , Rfl:    Insulin Detemir (LEVEMIR [place]), Inject 22-28 Units into  the skin at bedtime. , Disp: , Rfl:    levothyroxine (SYNTHROID, LEVOTHROID) 137 MCG tablet, Take 137 mcg by mouth daily before breakfast., Disp: , Rfl:    metFORMIN (GLUCOPHAGE-XR) 750 MG 24 hr tablet, Take 750 mg by mouth daily with breakfast. Take 1/2 tablet in the AM and 1 tablet in the PM , Disp: , Rfl:    omeprazole  (PRILOSEC) 40 MG capsule, TAKE 1 CAPSULE BY MOUTH DAILY, Disp: 90 capsule, Rfl: 0   rosuvastatin (CRESTOR) 20 MG tablet, Take 10 mg by mouth daily., Disp: , Rfl:     solifenacin (VESICARE) 10 MG tablet, Take 10 mg by mouth daily., Disp: , Rfl:    tamsulosin (FLOMAX) 0.4 MG CAPS capsule, Take 0.4 mg by mouth. Take  1 capsule 30 minutes after the same meal each day, Disp: , Rfl:    Physical Exam:   There were no vitals taken for this visit.  Salient findings:  Unable to perform because phone visit  Seprately Identifiable Procedures:  Prior to initiating any procedures, risks/benefits/alternatives were explained to the patient and verbal consent obtained. None  Impression & Plans:  George Ford is a 79 y.o. male with:  1. Sensorineural hearing loss, bilateral   2. Bilateral tinnitus    Bilateral tinnitus, worsening, most likely due to presbycusis.  Discussed Rx including lipoflavonoids (cannot try), hearing aids, masking, and retraining. He reports he is not to a point where he would consider HA so would like to observe. Also discussed risk of retrocochlear lesion and discussed MRI but he declined; return precautions discussed Will recheck hearing in 1 year -- if stable, does not necessarily need to see me unless has issues or wishes for MRI   See below regarding exact medications prescribed this encounter including dosages and route: No orders of the defined types were placed in this encounter.     Thank you for allowing me the opportunity to care for your patient. Please do not hesitate to contact me should you have any other questions.  Sincerely, Eldora Blanch, MD Otolaryngologist (ENT), [facility] Phone: [phone] Fax: [phone]  09/23/2024, 4:50 PM   MDM:  [phone] Complexity/Problems addressed: low - chronic problem, stable Data complexity: low - review of test - Morbidity: low  - Prescription Drug prescribed or managed: n
